# Patient Record
Sex: Female | Born: 1995 | Race: White | Hispanic: No | Marital: Single | Smoking: Never smoker
Health system: Southern US, Community
[De-identification: ages and names within clinical notes are randomized; demographics above are authoritative.]

## PROBLEM LIST (undated history)

## (undated) DIAGNOSIS — D649 Anemia, unspecified: Secondary | ICD-10-CM

## (undated) DIAGNOSIS — R569 Unspecified convulsions: Secondary | ICD-10-CM

## (undated) DIAGNOSIS — A1801 Tuberculosis of spine: Secondary | ICD-10-CM

## (undated) DIAGNOSIS — F32A Depression, unspecified: Secondary | ICD-10-CM

## (undated) DIAGNOSIS — Q600 Renal agenesis, unilateral: Secondary | ICD-10-CM

## (undated) HISTORY — DX: Renal agenesis, unilateral: Q60.0

## (undated) HISTORY — PX: ABDOMINAL EXPLORATION SURGERY: SHX538

## (undated) HISTORY — DX: Depression, unspecified: F32.A

## (undated) HISTORY — DX: Unspecified convulsions: R56.9

## (undated) HISTORY — DX: Tuberculosis of spine: A18.01

## (undated) HISTORY — PX: ANKLE SURGERY: SHX546

## (undated) HISTORY — DX: Anemia, unspecified: D64.9

---

## 2015-08-23 HISTORY — PX: ABDOMINAL HYSTERECTOMY: SHX81

## 2020-08-22 HISTORY — PX: HIP ARTHROSCOPY W/ LABRAL REPAIR: SHX1750

## 2020-12-09 HISTORY — PX: HIP SURGERY: SHX245

## 2021-03-19 ENCOUNTER — Encounter: Payer: Self-pay | Admitting: Neurology

## 2021-03-24 ENCOUNTER — Encounter: Payer: Self-pay | Admitting: Neurology

## 2021-03-24 ENCOUNTER — Other Ambulatory Visit: Payer: Self-pay

## 2021-03-24 ENCOUNTER — Ambulatory Visit (INDEPENDENT_AMBULATORY_CARE_PROVIDER_SITE_OTHER): Payer: BC Managed Care – PPO | Admitting: Neurology

## 2021-03-24 VITALS — BP 106/68 | HR 82 | Ht 70.0 in | Wt 138.6 lb

## 2021-03-24 DIAGNOSIS — R42 Dizziness and giddiness: Secondary | ICD-10-CM | POA: Diagnosis not present

## 2021-03-24 DIAGNOSIS — G40309 Generalized idiopathic epilepsy and epileptic syndromes, not intractable, without status epilepticus: Secondary | ICD-10-CM | POA: Diagnosis not present

## 2021-03-24 NOTE — Progress Notes (Signed)
NEUROLOGY CONSULTATION NOTE  Regina Kerr MRN: [id number] DOB: February 28, 1996  Referring provider: Dr. Beaulah Corin Primary care provider: none listed  Reason for consult:  establish care for epilepsy  Dear Dr Maisie Fus:  Thank you for your kind referral of Regina Kerr for consultation of the above symptoms. Although their history is well known to you, please allow me to reiterate it for the purpose of our medical record. She is alone in the office today. Records and images were personally reviewed where available.   HISTORY OF PRESENT ILLNESS: This is a 25 year old right-handed female with a history of kidney stones, epilepsy, presenting to establish care. Records from Dr. Maisie Fus, their epileptologist in [place] from 2017 to 2022, were reviewed, last visit was in 01/2021. At age 44, they started having episodes of staring, they could see and hear but could not understand and was answering gibberish. The first generalized convulsion occurred at age 9, they had woken up feeling unwell, very dizzy and lay on the couch, waking up to EMS around them. They were seen in the ER and started on seizure medication which they took until age 51. They were seizure-free off medication until they had surgery at age 58 and seizures recurred. They recall episodes in 2018 where they would have "brain zaps" and feel "twitchy" but conscious, no loss of time, occurring 50-100 times before they went to bed. This lasted for the summer then stopped. 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 Prior ASMs included Keppra, Tegretol, Depakote, Topamax, Lamictal (rash), which caused mood changes or oversedation. They tolerated Vimpat but stopped it on their own. They estimated around 10 lifetime GTC seizures and states they have not had "real" seizures since age 89. Every time they have anesthesia, they have multiple  reportedly convulsive type seizures coming out of anesthesia, last time this occurred was a few hours after hip surgery in April 2022 where they had 7 seizures. Last seizure was in June 2022 with an aura of body getting warm like a wave from feet to head and a swhoosh sound in their head. No loss of awareness but some confusion. Seizures usually occur before bedtime or early morning, triggered by poor sleep. They live with their partner who has described seizures starting with staring/unresponsivenes followed by a GTC. No focal weakness after, they usually have a headache.   They have been on Vimpat 200mg  BID. In October 2021 they were having significant nausea, dizziness "24/7" with trouble walking, that lasted until April 2022. Consideration for switching Vimpat was done, however they reported symptoms suddenly improved, dizziness is not as bad with a spinning sensation like the room is moving/tilting, now occurring sporadically 1-2 times a day lasting 15-60 minutes. They say they feel it right now during today's visit. No associated headaches, focal numbness/tingling/weakness. No neck/back pain, bladder dysfunction. There is some constipation. Mood is good on mirtazapine 60mg  daily, sleep good with Trazodone 75mg  qhs. They drive. No pregnancy plans, s/p hysterectomy.   Epilepsy Risk Factors:  Brother had seizures secondary to brain tumor. Otherwise they had a normal birth and early development.  There is no history of febrile convulsions, CNS infections such as meningitis/encephalitis, significant traumatic brain injury, neurosurgical procedures.  Diagnostic Data: Prior MRI report unavailable for review, patient noted it may have showed an abnoramlity of swelling in the left frontal lobe but unclear. MRI brain 05/2020 with and without contrast unremarkable MRI  brain 10/2015: unremarkable  EEG 10/2015 within normal limits. EEG 04/2018: single burst of irregular slowing with what appears to be intermixed  spikes.   Prior ASMs: Keppra, Tegretol, Depakote, Topamax, Lamictal (rash   PAST MEDICAL HISTORY: Past Medical History:  Diagnosis Date   Congenital absence of one kidney    Seizures (HCC)     PAST SURGICAL HISTORY: Past Surgical History:  Procedure Laterality Date   ABDOMINAL EXPLORATION SURGERY     ABDOMINAL HYSTERECTOMY  2017   ANKLE SURGERY     x 3   HIP ARTHROSCOPY W/ LABRAL REPAIR  2022   HIP SURGERY  12/09/2020    MEDICATIONS: Current Outpatient Medications on File Prior to Visit  Medication Sig Dispense Refill   lacosamide (VIMPAT) 200 MG TABS tablet Take 200 mg by mouth 2 (two) times daily.     linaclotide (LINZESS) 72 MCG capsule Take 72 mcg by mouth daily before breakfast.     meloxicam (MOBIC) 15 MG tablet Take 15 mg by mouth daily.     MIRTAZAPINE PO Take 60 mg by mouth daily.     traZODone (DESYREL) 50 MG tablet Take 75 mg by mouth at bedtime.     No current facility-administered medications on file prior to visit.    ALLERGIES: Allergies  Allergen Reactions   Amoxicillin     FAMILY HISTORY: Family History  Problem Relation Age of Onset   Hypertension Maternal Grandmother    Diabetes Maternal Grandmother     SOCIAL HISTORY: Social History   Socioeconomic History   Marital status: Significant Other    Spouse name: Not on file   Number of children: Not on file   Years of education: Not on file   Highest education level: Not on file  Occupational History   Not on file  Tobacco Use   Smoking status: Never   Smokeless tobacco: Never  Vaping Use   Vaping Use: Never used  Substance and Sexual Activity   Alcohol use: Never   Drug use: Never   Sexual activity: Not on file  Other Topics Concern   Not on file  Social History Narrative   Right handed    Social Determinants of Health   Financial Resource Strain: Not on file  Food Insecurity: Not on file  Transportation Needs: Not on file  Physical Activity: Not on file  Stress: Not on  file  Social Connections: Not on file  Intimate Partner Violence: Not on file     PHYSICAL EXAM: Vitals:   03/24/21 0840  BP: 106/68  Pulse: 82  SpO2: 99%   General: No acute distress Head:  Normocephalic/atraumatic Skin/Extremities: No rash, no edema Neurological Exam: Mental status: alert and oriented to person, place, and time, no dysarthria or aphasia, Fund of knowledge is appropriate.  Recent and remote memory are intact, 3/3 delayed recall.  Attention and concentration are normal, 5/5 WORLD backward. Cranial nerves: CN I: not tested CN II: pupils equal, round and reactive to light, visual fields intact CN III, IV, VI:  full range of motion, no nystagmus, no ptosis CN V: facial sensation intact CN VII: upper and lower face symmetric CN VIII: hearing intact to conversation Bulk & Tone: normal, no fasciculations. Motor: 5/5 throughout with no pronator drift. Sensation: intact to light touch, cold, pin, vibration and joint position sense.  No extinction to double simultaneous stimulation.  Romberg test negative Deep Tendon Reflexes: +2 throughout Cerebellar: no incoordination on finger to nose testing Gait: narrow-based and steady, able  to tandem walk adequately. Tremor: none   IMPRESSION: This is a 24 year old right-handed female with a history of kidney stones, epilepsy, presenting to establish care. EEG showed single burst of irregular generalized slowing with possible intermixed spikes concerning for generalized epilepsy. They have had side effects to various medications, currently overall doing well on Lacosamide 200mg  BID with last seizure in 01/2021. They were having significant dizziness, this has improved but still occurs 1-2 times a day, they will be referred for vestibular therapy to see if this helps with symptoms.  [place] driving laws were discussed with the patient, and they knows to stop driving after a seizure, until 6 months seizure-free. Follow-up in 6 months, call  for any changes.    Thank you for allowing me to participate in the care of this patient. Please do not hesitate to call for any questions or concerns.   02/2021, M.D.  CC: Dr. Patrcia Dolly

## 2021-03-24 NOTE — Patient Instructions (Signed)
Good to meet you!  Continue Lacosamide 200mg  twice a day  2. Referral will be sent for Vestibular therapy  3. Follow-up in 6 months, call for any changes   Seizure Precautions: 1. If medication has been prescribed for you to prevent seizures, take it exactly as directed.  Do not stop taking the medicine without talking to your doctor first, even if you have not had a seizure in a long time.   2. Avoid activities in which a seizure would cause danger to yourself or to others.  Don't operate dangerous machinery, swim alone, or climb in high or dangerous places, such as on ladders, roofs, or girders.  Do not drive unless your doctor says you may.  3. If you have any warning that you may have a seizure, lay down in a safe place where you can't hurt yourself.    4.  No driving for 6 months from last seizure, as per [facility].   Please refer to the following link on the Epilepsy Foundation of America's website for more information: http://www.epilepsyfoundation.org/answerplace/Social/driving/drivingu.cfm   5.  Maintain good sleep hygiene. Avoid alcohol.  6.  Contact your doctor if you have any problems that may be related to the medicine you are taking.  7.  Call 911 and bring the patient back to the ED if:        A.  The seizure lasts longer than 5 minutes.       B.  The patient doesn't awaken shortly after the seizure  C.  The patient has new problems such as difficulty seeing, speaking or moving  D.  The patient was injured during the seizure  E.  The patient has a temperature over 102 F (39C)  F.  The patient vomited and now is having trouble breathing

## 2021-03-30 ENCOUNTER — Other Ambulatory Visit: Payer: Self-pay

## 2021-03-31 MED ORDER — LACOSAMIDE 200 MG PO TABS: 200.0000 mg | ORAL_TABLET | Freq: Two times a day (BID) | ORAL | 5 refills | Status: DC

## 2021-04-07 ENCOUNTER — Other Ambulatory Visit: Payer: Self-pay

## 2021-04-07 ENCOUNTER — Ambulatory Visit: Payer: BC Managed Care – PPO | Admitting: Nurse Practitioner

## 2021-04-07 ENCOUNTER — Encounter: Payer: Self-pay | Admitting: Nurse Practitioner

## 2021-04-07 VITALS — BP 110/60 | HR 84 | Temp 98.5°F | Ht 69.6 in | Wt 137.8 lb

## 2021-04-07 DIAGNOSIS — Z7689 Persons encountering health services in other specified circumstances: Secondary | ICD-10-CM

## 2021-04-07 DIAGNOSIS — M25551 Pain in right hip: Secondary | ICD-10-CM | POA: Diagnosis not present

## 2021-04-07 DIAGNOSIS — G4700 Insomnia, unspecified: Secondary | ICD-10-CM

## 2021-04-07 DIAGNOSIS — F32A Depression, unspecified: Secondary | ICD-10-CM | POA: Diagnosis not present

## 2021-04-07 DIAGNOSIS — M25552 Pain in left hip: Secondary | ICD-10-CM

## 2021-04-07 DIAGNOSIS — R001 Bradycardia, unspecified: Secondary | ICD-10-CM | POA: Diagnosis not present

## 2021-04-07 MED ORDER — TRAZODONE HCL 50 MG PO TABS: 75.0000 mg | ORAL_TABLET | Freq: Every day | ORAL | 1 refills | Status: DC

## 2021-04-07 MED ORDER — MIRTAZAPINE 30 MG PO TABS: 60.0000 mg | ORAL_TABLET | Freq: Every day | ORAL | 0 refills | Status: DC

## 2021-04-07 MED ORDER — MELOXICAM 15 MG PO TABS: 15.0000 mg | ORAL_TABLET | Freq: Every day | ORAL | 0 refills | Status: DC

## 2021-04-07 NOTE — Progress Notes (Signed)
I,Yamilka Roman [employer] as a Neurosurgeon for [employer], NP.,have documented all relevant documentation on the behalf of [employer], NP,as directed by  Charlesetta Ivory, NP while in the presence of Charlesetta Ivory, NP.  This visit occurred during the SARS-CoV-2 public health emergency.  Safety protocols were in place, including screening questions prior to the visit, additional usage of staff PPE, and extensive cleaning of exam room while observing appropriate contact time as indicated for disinfecting solutions.  Subjective:     Patient ID: Regina Kerr , female    DOB: [birth year] , 25 y.o.   MRN: [id number]   Chief Complaint  Patient presents with   Insomnia   Depression   Joint Pain   Establish Care     HPI  Patient presents today to establish primary care. Goes by the name "Regina Kerr."  She recently moved here from [place]. She would like refills on her trazodone, mirtazapine and meloxicam. Reviewed labs, meds and history with patient.  She has been taking these meds for the past 3-4 years. Will retrieve results from her PCP and all the specialist she saw in [place].  PHD program for clinical psych at [facility]  Joint surgeries. 3 ankle and 2 hip surgery. Hysterectomy. She has Dystonia. Epilepsy. Sees a Neurologist. Low BP. Constipation. A lot of heart issues will refer to cardiologist.  Diet: Eats a protein, has sandwich in the lunch. Protein and carbs.  Exercise: She had hip surgery 4 months ago. So not doing much of exercise or physical activity .    Past Medical History:  Diagnosis Date   Anemia    Congenital absence of one kidney    Depression    Pott's disease    Seizures (HCC)      Family History  Problem Relation Age of Onset   Hypertension Maternal Grandmother    Diabetes Maternal Grandmother      Current Outpatient Medications:    lacosamide (VIMPAT) 200 MG TABS tablet, Take 1 tablet (200 mg total) by mouth 2 (two) times daily., Disp: 60  tablet, Rfl: 5   linaclotide (LINZESS) 72 MCG capsule, Take 72 mcg by mouth daily before breakfast., Disp: , Rfl:    mirtazapine (REMERON) 30 MG tablet, Take 2 tablets (60 mg total) by mouth daily., Disp: 60 tablet, Rfl: 0   meloxicam (MOBIC) 15 MG tablet, Take 1 tablet (15 mg total) by mouth daily., Disp: 30 tablet, Rfl: 0   traZODone (DESYREL) 50 MG tablet, Take 1.5 tablets (75 mg total) by mouth at bedtime., Disp: 30 tablet, Rfl: 1   Allergies  Allergen Reactions   Amoxicillin      Review of Systems  Constitutional: Negative.  Negative for chills, fatigue and fever.  HENT:  Negative for congestion, rhinorrhea, sinus pain and sneezing.   Eyes: Negative.   Respiratory:  Negative for choking, shortness of breath and wheezing.   Cardiovascular:  Negative for chest pain and palpitations.  Musculoskeletal:  Positive for arthralgias.  Skin: Negative.   Neurological: Negative.  Negative for headaches.  Psychiatric/Behavioral:  Positive for sleep disturbance.     Today's Vitals   04/07/21 0907  BP: 110/60  Pulse: 84  Temp: 98.5 F (36.9 C)  Weight: 137 lb 12.8 oz (62.5 kg)  Height: 5' 9.6" (1.768 m)  PainSc: 0-No pain   Body mass index is 20 kg/m.   Objective:  Physical Exam Constitutional:      Appearance: Normal appearance.  HENT:     Head: Normocephalic and atraumatic.  Cardiovascular:     Rate and Rhythm: Normal rate and regular rhythm.     Pulses: Normal pulses.     Heart sounds: Normal heart sounds. No murmur heard. Pulmonary:     Effort: Pulmonary effort is normal. No respiratory distress.     Breath sounds: Normal breath sounds. No wheezing.  Skin:    General: Skin is warm and dry.     Capillary Refill: Capillary refill takes less than 2 seconds.  Neurological:     Mental Status: She is alert.        Assessment And Plan:     1. Establishing care with new doctor, encounter for --Patient is here to establish care. Micah Flesher over patient medical, family, social  and surgical history. -Reviewed with patient their medications and any allergies  -Reviewed with patient their sexual orientation, drug/tobacco and alcohol use -Dicussed any new concerns with patient  -recommended patient comes in for a physical exam and complete blood work.  -Educated patient about the importance of annual screenings and immunizations.  -Advised patient to eat a healthy diet along with exercise for atleast 30-45 min atleast 4-5 days of the week.   2. Insomnia, unspecified type - traZODone (DESYREL) 50 MG tablet; Take 1.5 tablets (75 mg total) by mouth at bedtime.  Dispense: 30 tablet; Refill: 1  3. Depression, unspecified depression type - mirtazapine (REMERON) 30 MG tablet; Take 2 tablets (60 mg total) by mouth daily.  Dispense: 60 tablet; Refill: 0 - Ambulatory referral to Psychiatry  4. Bradycardia -History of low BP and HR.  - Ambulatory referral to Cardiology  5. Pain of both hip joints - meloxicam (MOBIC) 15 MG tablet; Take 1 tablet (15 mg total) by mouth daily.  Dispense: 30 tablet; Refill: 0   The patient was encouraged to call or send a message through MyChart for any questions or concerns.   Follow up: 3 months for complete physical exam.   Side effects and appropriate use of all the medication(s) were discussed with the patient today. Patient advised to use the medication(s) as directed by their healthcare provider. The patient was encouraged to read, review, and understand all associated package inserts and contact our office with any questions or concerns. The patient accepts the risks of the treatment plan and had an opportunity to ask questions.   -SE including drug interactions of all medications refilled were discussed in detail with the patient. Will retrieve records from the patient's prior PCP an specialists. Will refer patient to cardiologist and psychiatrist for her medical history and further management of her drug therapies. Patient verbalized  understanding of all medication SE and treatment plan.   Patient was given opportunity to ask questions. Patient verbalized understanding of the plan and was able to repeat key elements of the plan. All questions were answered to their satisfaction.  Raman Bular Hickok, DNP   I, Raman Sadrac Zeoli have reviewed all documentation for this visit. The documentation on 04/07/21 for the exam, diagnosis, procedures, and orders are all accurate and complete.    IF YOU HAVE BEEN REFERRED TO A SPECIALIST, IT MAY TAKE 1-2 WEEKS TO SCHEDULE/PROCESS THE REFERRAL. IF YOU HAVE NOT HEARD FROM US/SPECIALIST IN TWO WEEKS, PLEASE GIVE [place] A CALL AT [phone].   THE PATIENT IS ENCOURAGED TO PRACTICE SOCIAL DISTANCING DUE TO THE COVID-19 PANDEMIC.

## 2021-04-07 NOTE — Patient Instructions (Signed)
Insomnia Insomnia is a sleep disorder that makes it difficult to fall asleep or stay asleep. Insomnia can cause fatigue, low energy, difficulty concentrating, moodswings, and poor performance at work or school. There are three different ways to classify insomnia: Difficulty falling asleep. Difficulty staying asleep. Waking up too early in the morning. Any type of insomnia can be long-term (chronic) or short-term (acute). Both are common. Short-term insomnia usually lasts for three months or less. Chronic insomnia occurs at least three times a week for longer than threemonths. What are the causes? Insomnia may be caused by another condition, situation, or substance, such as: Anxiety. Certain medicines. Gastroesophageal reflux disease (GERD) or other gastrointestinal conditions. Asthma or other breathing conditions. Restless legs syndrome, sleep apnea, or other sleep disorders. Chronic pain. Menopause. Stroke. Abuse of alcohol, tobacco, or illegal drugs. Mental health conditions, such as depression. Caffeine. Neurological disorders, such as Alzheimer's disease. An overactive thyroid (hyperthyroidism). Sometimes, the cause of insomnia may not be known. What increases the risk? Risk factors for insomnia include: Gender. Women are affected more often than men. Age. Insomnia is more common as you get older. Stress. Lack of exercise. Irregular work schedule or working night shifts. Traveling between different time zones. Certain medical and mental health conditions. What are the signs or symptoms? If you have insomnia, the main symptom is having trouble falling asleep or having trouble staying asleep. This may lead to other symptoms, such as: Feeling fatigued or having low energy. Feeling nervous about going to sleep. Not feeling rested in the morning. Having trouble concentrating. Feeling irritable, anxious, or depressed. How is this diagnosed? This condition may be diagnosed based  on: Your symptoms and medical history. Your health care provider may ask about: Your sleep habits. Any medical conditions you have. Your mental health. A physical exam. How is this treated? Treatment for insomnia depends on the cause. Treatment may focus on treating an underlying condition that is causing insomnia. Treatment may also include: Medicines to help you sleep. Counseling or therapy. Lifestyle adjustments to help you sleep better. Follow these instructions at home: Eating and drinking  Limit or avoid alcohol, caffeinated beverages, and cigarettes, especially close to bedtime. These can disrupt your sleep. Do not eat a large meal or eat spicy foods right before bedtime. This can lead to digestive discomfort that can make it hard for you to sleep.  Sleep habits  Keep a sleep diary to help you and your health care provider figure out what could be causing your insomnia. Write down: When you sleep. When you wake up during the night. How well you sleep. How rested you feel the next day. Any side effects of medicines you are taking. What you eat and drink. Make your bedroom a dark, comfortable place where it is easy to fall asleep. Put up shades or blackout curtains to block light from outside. Use a white noise machine to block noise. Keep the temperature cool. Limit screen use before bedtime. This includes: Watching TV. Using your smartphone, tablet, or computer. Stick to a routine that includes going to bed and waking up at the same times every day and night. This can help you fall asleep faster. Consider making a quiet activity, such as reading, part of your nighttime routine. Try to avoid taking naps during the day so that you sleep better at night. Get out of bed if you are still awake after 15 minutes of trying to sleep. Keep the lights down, but try reading or doing a quiet   activity. When you feel sleepy, go back to bed.  General instructions Take over-the-counter  and prescription medicines only as told by your health care provider. Exercise regularly, as told by your health care provider. Avoid exercise starting several hours before bedtime. Use relaxation techniques to manage stress. Ask your health care provider to suggest some techniques that may work well for you. These may include: Breathing exercises. Routines to release muscle tension. Visualizing peaceful scenes. Make sure that you drive carefully. Avoid driving if you feel very sleepy. Keep all follow-up visits as told by your health care provider. This is important. Contact a health care provider if: You are tired throughout the day. You have trouble in your daily routine due to sleepiness. You continue to have sleep problems, or your sleep problems get worse. Get help right away if: You have serious thoughts about hurting yourself or someone else. If you ever feel like you may hurt yourself or others, or have thoughts about taking your own life, get help right away. You can go to your nearest emergency department or call: Your local emergency services (911 in the [place] A suicide crisis helpline, such as the National Suicide Prevention Lifeline at 1-800-273-8255. This is open 24 hours a day. Summary Insomnia is a sleep disorder that makes it difficult to fall asleep or stay asleep. Insomnia can be long-term (chronic) or short-term (acute). Treatment for insomnia depends on the cause. Treatment may focus on treating an underlying condition that is causing insomnia. Keep a sleep diary to help you and your health care provider figure out what could be causing your insomnia. This information is not intended to replace advice given to you by your health care provider. Make sure you discuss any questions you have with your healthcare provider. Document Revised: 06/18/2020 Document Reviewed: 06/18/2020 Elsevier Patient Education  2022 [employer]  

## 2021-05-03 ENCOUNTER — Other Ambulatory Visit: Payer: Self-pay

## 2021-05-03 ENCOUNTER — Ambulatory Visit: Payer: BC Managed Care – PPO | Admitting: Internal Medicine

## 2021-05-03 ENCOUNTER — Encounter: Payer: Self-pay | Admitting: Internal Medicine

## 2021-05-03 DIAGNOSIS — M25551 Pain in right hip: Secondary | ICD-10-CM | POA: Diagnosis not present

## 2021-05-03 DIAGNOSIS — R001 Bradycardia, unspecified: Secondary | ICD-10-CM

## 2021-05-03 DIAGNOSIS — F32A Depression, unspecified: Secondary | ICD-10-CM

## 2021-05-03 DIAGNOSIS — G4700 Insomnia, unspecified: Secondary | ICD-10-CM | POA: Diagnosis not present

## 2021-05-03 DIAGNOSIS — G40909 Epilepsy, unspecified, not intractable, without status epilepticus: Secondary | ICD-10-CM

## 2021-05-03 DIAGNOSIS — Q600 Renal agenesis, unilateral: Secondary | ICD-10-CM

## 2021-05-03 DIAGNOSIS — I498 Other specified cardiac arrhythmias: Secondary | ICD-10-CM | POA: Insufficient documentation

## 2021-05-03 DIAGNOSIS — Z8674 Personal history of sudden cardiac arrest: Secondary | ICD-10-CM

## 2021-05-03 DIAGNOSIS — K5900 Constipation, unspecified: Secondary | ICD-10-CM | POA: Diagnosis not present

## 2021-05-03 DIAGNOSIS — G90A Postural orthostatic tachycardia syndrome (POTS): Secondary | ICD-10-CM | POA: Insufficient documentation

## 2021-05-03 DIAGNOSIS — G901 Familial dysautonomia [Riley-Day]: Secondary | ICD-10-CM

## 2021-05-03 DIAGNOSIS — M25552 Pain in left hip: Secondary | ICD-10-CM

## 2021-05-03 MED ORDER — TRAZODONE HCL 50 MG PO TABS: 75.0000 mg | ORAL_TABLET | Freq: Every day | ORAL | 3 refills | Status: DC

## 2021-05-03 MED ORDER — MELOXICAM 15 MG PO TABS: 15.0000 mg | ORAL_TABLET | Freq: Every day | ORAL | 3 refills | Status: DC

## 2021-05-03 MED ORDER — LUBIPROSTONE 8 MCG PO CAPS: 8.0000 ug | ORAL_CAPSULE | Freq: Every day | ORAL | 1 refills | Status: DC

## 2021-05-03 MED ORDER — MIRTAZAPINE 30 MG PO TABS: 60.0000 mg | ORAL_TABLET | Freq: Every day | ORAL | 1 refills | Status: DC

## 2021-05-03 NOTE — Progress Notes (Signed)
   Subjective:   Patient ID: Regina Kerr, female    DOB: 1995-09-27, 25 y.o.   MRN: [id number]  HPI The patient is a 25 YO coming new from [place], needing follow up of chronic health concerns and ongoing care. With episode of bradycardia to 30s with some increase in seizures in the last year. No pacemaker placed and resolved on its own holter done without recurrence after hospital. Last seizure last week. Taking vimpat and has established with neurologist in town. 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 Eating is okay now and mental health is controlled with remeron 60 mg daily and trazodone 75 mg daily for sleep. Some orthopedic problems as well from past sports and other with ankle and hips, recent hip replacement earlier this year.  PMH, [facility], social history reviewed and updated  Review of Systems  Constitutional: Negative.   HENT: Negative.    Eyes: Negative.   Respiratory:  Negative for cough, chest tightness and shortness of breath.   Cardiovascular:  Negative for chest pain, palpitations and leg swelling.  Gastrointestinal:  Negative for abdominal distention, abdominal pain, constipation, diarrhea, nausea and vomiting.  Musculoskeletal:  Positive for arthralgias and myalgias.  Skin: Negative.   Neurological:  Positive for seizures.  Psychiatric/Behavioral: Negative.     Objective:  Physical Exam Constitutional:      Appearance: She is well-developed.  HENT:     Head: Normocephalic and atraumatic.  Cardiovascular:     Rate and Rhythm: Normal rate and regular rhythm.     Heart sounds: No murmur heard. Pulmonary:     Effort: Pulmonary effort is normal. No respiratory distress.     Breath sounds: Normal breath sounds. No wheezing or rales.  Abdominal:     General: Bowel sounds are normal. There is no distension.     Palpations: Abdomen is soft.     Tenderness: There is no abdominal tenderness. There is no rebound.  Musculoskeletal:      Cervical back: Normal range of motion.  Skin:    General: Skin is warm and dry.  Neurological:     Mental Status: She is alert and oriented to person, place, and time.     Coordination: Coordination normal.    Vitals:   05/03/21 0801  BP: 118/80  Pulse: 66  Resp: 18  Temp: 98.1 F (36.7 C)  TempSrc: Oral  SpO2: 99%  Weight: 138 lb 6.4 oz (62.8 kg)  Height: 5' 9.6" (1.768 m)    This visit occurred during the SARS-CoV-2 public health emergency.  Safety protocols were in place, including screening questions prior to the visit, additional usage of staff PPE, and extensive cleaning of exam room while observing appropriate contact time as indicated for disinfecting solutions.   Assessment & Plan:

## 2021-05-03 NOTE — Patient Instructions (Addendum)
We will change the linzess to amitiza to try to take daily for the constipation.  We have sent in the refills of the medicines.   We will get you in with the cardiologist to have them in case we need them.

## 2021-05-03 NOTE — Assessment & Plan Note (Signed)
Since childhood and likely could be contributed to by medications. Has used linzess prn as it does cause multiple BM not much warning. Will try amitiza daily instead to see if this is more predictable. They have already tried most otc options without relief.

## 2021-05-03 NOTE — Assessment & Plan Note (Signed)
This was most likely diagnosis with recent hospital stay per patient. Will refer to cardiology. It is unclear if consideration for pacemaker is needed or further monitoring with HR 30s (possible seizure related or seizure caused by low HR). Getting records to review in more detail.

## 2021-05-04 DIAGNOSIS — Q600 Renal agenesis, unilateral: Secondary | ICD-10-CM | POA: Insufficient documentation

## 2021-05-04 DIAGNOSIS — M25551 Pain in right hip: Secondary | ICD-10-CM | POA: Insufficient documentation

## 2021-05-04 DIAGNOSIS — G4700 Insomnia, unspecified: Secondary | ICD-10-CM | POA: Insufficient documentation

## 2021-05-04 DIAGNOSIS — Z8674 Personal history of sudden cardiac arrest: Secondary | ICD-10-CM | POA: Insufficient documentation

## 2021-05-04 DIAGNOSIS — F32A Depression, unspecified: Secondary | ICD-10-CM | POA: Insufficient documentation

## 2021-05-04 DIAGNOSIS — M25552 Pain in left hip: Secondary | ICD-10-CM | POA: Insufficient documentation

## 2021-05-04 NOTE — Assessment & Plan Note (Signed)
Per reports will get records to verify and clarify renal function. Will plan for labs every 6 months (done this summer) until stable then yearly at least.

## 2021-05-04 NOTE — Assessment & Plan Note (Signed)
Will clarify this diagnosis from records.

## 2021-05-04 NOTE — Assessment & Plan Note (Signed)
Per patient in teens due to eating disorder. Will get records to review.

## 2021-05-04 NOTE — Assessment & Plan Note (Signed)
Rx trazodone 75 mg qhs for sleep which is prescribed today.

## 2021-05-04 NOTE — Assessment & Plan Note (Signed)
Taking mirtazapine 60 mg daily and controlling symptoms well. They are adequately controlled currently so medication is prescribed at current dosing.

## 2021-05-04 NOTE — Assessment & Plan Note (Signed)
Seeing neurology and most recent seizure last week. Most seizure episodes are in morning or before sleep. Advised of [place] rules to not drive within 6 months of seizure.

## 2021-05-04 NOTE — Assessment & Plan Note (Signed)
Referral to cardiology and getting records from [place]. There was prior hospitalization due to bradycardia around time of seizure with HR 30s. It does not sound like intervention was done and this did normalize. There is history of eating disorder which could be related and they add that there is also history of a RV conduction delay.

## 2021-05-04 NOTE — Assessment & Plan Note (Signed)
Rx meloxicam which has been prescribed in the past for arthritis pain. Will need to get records as they do have 1 kidney per reports to ensure normal renal function.

## 2021-06-05 DIAGNOSIS — H52221 Regular astigmatism, right eye: Secondary | ICD-10-CM | POA: Diagnosis not present

## 2021-06-05 DIAGNOSIS — H5212 Myopia, left eye: Secondary | ICD-10-CM | POA: Diagnosis not present

## 2021-07-13 ENCOUNTER — Encounter: Payer: Self-pay | Admitting: Neurology

## 2021-09-20 ENCOUNTER — Other Ambulatory Visit: Payer: Self-pay

## 2021-09-20 ENCOUNTER — Ambulatory Visit: Payer: BC Managed Care – PPO | Admitting: Neurology

## 2021-09-20 ENCOUNTER — Encounter: Payer: Self-pay | Admitting: Neurology

## 2021-09-20 VITALS — BP 117/63 | HR 72 | Ht 70.0 in | Wt 150.0 lb

## 2021-09-20 DIAGNOSIS — G40309 Generalized idiopathic epilepsy and epileptic syndromes, not intractable, without status epilepticus: Secondary | ICD-10-CM | POA: Diagnosis not present

## 2021-09-20 MED ORDER — NAYZILAM 5 MG/0.1ML NA SOLN: NASAL | 5 refills | Status: DC

## 2021-09-20 MED ORDER — LACOSAMIDE 200 MG PO TABS: 200.0000 mg | ORAL_TABLET | Freq: Two times a day (BID) | ORAL | 5 refills | Status: DC

## 2021-09-20 NOTE — Progress Notes (Signed)
NEUROLOGY FOLLOW UP OFFICE NOTE  Regina Kerr [id number] Sep 21, 1995  HISTORY OF PRESENT ILLNESS: I had the pleasure of seeing Regina Kerr in follow-up in the neurology clinic on 09/20/2021.  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 They are accompanied by their significant other Regina Kerr who helps supplement the history today. Since their last visit, they have been doing mostly good. There was one seizure in October 2022, they took evening medication (Trazodone) in the morning. It made them feel very tired and dizzy while in the dog park, they were lightheaded and lay back in the car where a couple of seizures occurred with shaking. No tongue bite or incontinence. They were periodically waking up. They felt good afterward. The spinning sensation is a lot better, no headaches. No staring/unresponsive episodes, olfactory/gustatory hallucinations, focal numbness/tingling/weakness/myoclonic jerks. They are on Lacosamide 200mg  BID without significant side effects. Regina Kerr has noticed for almost 2 years that they frequently substitue words, saying "washer" instead of "dryer," saying "yesterday" but meaning to say "tomorrow." This occurs multiple times a day, sometimes they notice it, other times people will alert them. They are a Regina Kerr in Psychology. They get 8-10 hours of sleep.     History on Initial Assessment 03/24/2021: This is a 26 year old right-handed female with a history of kidney stones, epilepsy, presenting to establish care. Records from Dr. 22, their epileptologist in [place] from 2017 to 2022, were reviewed, last visit was in 01/2021. At age 23, they started having episodes of staring, they could see and hear but could not understand and was answering gibberish. The first generalized convulsion occurred at age 26, they had woken up feeling unwell, very dizzy and lay on the couch, waking up to EMS around them. They were seen in the ER and started  on seizure medication which they took until age 26. They were seizure-free off medication until they had surgery at age 8 and seizures recurred. They recall episodes in 2018 where they would have "brain zaps" and feel "twitchy" but conscious, no loss of time, occurring 50-100 times before they went to bed. This lasted for the summer then stopped. 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 Prior ASMs included Keppra, Tegretol, Depakote, Topamax, Lamictal (rash), which caused mood changes or oversedation. They tolerated Vimpat but stopped it on their own. They estimated around 10 lifetime GTC seizures and states they have not had "real" seizures since age 26. Every time they have anesthesia, they have multiple reportedly convulsive type seizures coming out of anesthesia, last time this occurred was a few hours after hip surgery in April 2022 where they had 7 seizures. Last seizure was in June 2022 with an aura of body getting warm like a wave from feet to head and a swhoosh sound in their head. No loss of awareness but some confusion. Seizures usually occur before bedtime or early morning, triggered by poor sleep. They live with their partner who has described seizures starting with staring/unresponsivenes followed by a GTC. No focal weakness after, they usually have a headache.   They have been on Vimpat 200mg  BID. In October 2021 they were having significant nausea, dizziness "24/7" with trouble walking, that lasted until April 2022. Consideration for switching Vimpat was done, however they reported symptoms suddenly improved, dizziness is not as bad with a spinning sensation like the room is moving/tilting, now occurring sporadically 1-2 times a day  lasting 15-60 minutes. They say they feel it right now during today's visit. No associated headaches, focal numbness/tingling/weakness. No neck/back pain, bladder  dysfunction. There is some constipation. Mood is good on mirtazapine 60mg  daily, sleep good with Trazodone 75mg  qhs. They drive. No pregnancy plans, s/p hysterectomy.   Epilepsy Risk Factors:  Brother had seizures secondary to brain tumor. Otherwise they had a normal birth and early development.  There is no history of febrile convulsions, CNS infections such as meningitis/encephalitis, significant traumatic brain injury, neurosurgical procedures.  Diagnostic Data: Prior MRI report unavailable for review, patient noted it may have showed an abnoramlity of swelling in the left frontal lobe but unclear. MRI brain 05/2020 with and without contrast unremarkable MRI brain 10/2015: unremarkable  EEG 10/2015 within normal limits. EEG 04/2018: single burst of irregular slowing with what appears to be intermixed spikes.   Prior ASMs: Keppra, Tegretol, Depakote, Topamax, Lamictal (rash   PAST MEDICAL HISTORY: Past Medical History:  Diagnosis Date   Anemia    Congenital absence of one kidney    Depression    Pott's disease    Seizures (HCC)     MEDICATIONS: Current Outpatient Medications on File Prior to Visit  Medication Sig Dispense Refill   lacosamide (VIMPAT) 200 MG TABS tablet Take 1 tablet (200 mg total) by mouth 2 (two) times daily. 60 tablet 5   linaclotide (LINZESS) 72 MCG capsule Take 72 mcg by mouth daily before breakfast.     lubiprostone (AMITIZA) 8 MCG capsule Take 1 capsule (8 mcg total) by mouth daily with breakfast. 90 capsule 1   meloxicam (MOBIC) 15 MG tablet Take 1 tablet (15 mg total) by mouth daily. 90 tablet 3   mirtazapine (REMERON) 30 MG tablet Take 2 tablets (60 mg total) by mouth daily. 180 tablet 1   traZODone (DESYREL) 50 MG tablet Take 1.5 tablets (75 mg total) by mouth at bedtime. (Patient not taking: Reported on 09/20/2021) 135 tablet 3   No current facility-administered medications on file prior to visit.    ALLERGIES: Allergies  Allergen Reactions    Amoxicillin     FAMILY HISTORY: Family History  Problem Relation Age of Onset   Hypertension Maternal Grandmother    Diabetes Maternal Grandmother     SOCIAL HISTORY: Social History   Socioeconomic History   Marital status: Significant Other    Spouse name: Not on file   Number of children: Not on file   Years of education: Not on file   Highest education level: Not on file  Occupational History   Not on file  Tobacco Use   Smoking status: Never   Smokeless tobacco: Never  Vaping Use   Vaping Use: Never used  Substance and Sexual Activity   Alcohol use: Never   Drug use: Never   Sexual activity: Not on file  Other Topics Concern   Not on file  Social History Narrative   Right handed    Social Determinants of Health   Financial Resource Strain: Not on file  Food Insecurity: Not on file  Transportation Needs: Not on file  Physical Activity: Not on file  Stress: Not on file  Social Connections: Not on file  Intimate Partner Violence: Not on file     PHYSICAL EXAM: Vitals:   09/20/21 1438  BP: 117/63  Pulse: 72  SpO2: 98%   General: No acute distress Head:  Normocephalic/atraumatic Skin/Extremities: No rash, no edema Neurological Exam: alert awake. No aphasia or dysarthria. Fund of knowledge is  appropriate.  Attention and concentration are normal.   Cranial nerves: Pupils equal, round. Extraocular movements intact with no nystagmus. Visual fields full.  No facial asymmetry.  Motor: Bulk and tone normal, muscle strength 5/5 throughout with no pronator drift.   Finger to nose testing intact.  Gait narrow-based and steady, able to tandem walk adequately.  Romberg negative.   IMPRESSION: This is a 26 yo RH female with a history of kidney stones with generalized epilepsy. EEG showed single burst of irregular generalized slowing with possible intermixed spikes. Overall doing well with a couple of seizures in October 2022, tolerating Lacosamide 200mg  BID. Nayzilam  nasal spray will be prescribed to prevent seizure clusters, side effects discussed. Consider speech therapy for speech changes. They are aware of [place] driving laws to stop driving after a seizure until 6 months seizure-free. Follow-up in 6 months, call for any changes.   Thank you for allowing me to participate in their care.  Please do not hesitate to call for any questions or concerns.    , M.D.   CC: Dr. Patrcia Dolly

## 2021-09-20 NOTE — Patient Instructions (Signed)
Good to see you.  Continue Vimpat 200mg  twice a day  2. Prescription for Nayzilam nasal spray has been sent for seizure rescue.  3. Consider doing speech therapy  4. Follow-up in 6 months, call for any changes   Seizure Precautions: 1. If medication has been prescribed for you to prevent seizures, take it exactly as directed.  Do not stop taking the medicine without talking to your doctor first, even if you have not had a seizure in a long time.   2. Avoid activities in which a seizure would cause danger to yourself or to others.  Don't operate dangerous machinery, swim alone, or climb in high or dangerous places, such as on ladders, roofs, or girders.  Do not drive unless your doctor says you may.  3. If you have any warning that you may have a seizure, lay down in a safe place where you can't hurt yourself.    4.  No driving for 6 months from last seizure, as per [facility].   Please refer to the following link on the [place] website for more information: http://www.epilepsyfoundation.org/answerplace/Social/driving/drivingu.cfm   5.  Maintain good sleep hygiene. Avoid alcohol.  6.  Contact your doctor if you have any problems that may be related to the medicine you are taking.  7.  Call 911 and bring the patient back to the ED if:        A.  The seizure lasts longer than 5 minutes.       B.  The patient doesn't awaken shortly after the seizure  C.  The patient has new problems such as difficulty seeing, speaking or moving  D.  The patient was injured during the seizure  E.  The patient has a temperature over 102 F (39C)  F.  The patient vomited and now is having trouble breathing

## 2021-09-27 DIAGNOSIS — F439 Reaction to severe stress, unspecified: Secondary | ICD-10-CM | POA: Diagnosis not present

## 2021-09-29 ENCOUNTER — Ambulatory Visit: Payer: BC Managed Care – PPO | Admitting: Neurology

## 2021-10-11 ENCOUNTER — Ambulatory Visit: Payer: BC Managed Care – PPO | Admitting: Neurology

## 2021-10-19 DIAGNOSIS — F4312 Post-traumatic stress disorder, chronic: Secondary | ICD-10-CM | POA: Diagnosis not present

## 2021-10-26 DIAGNOSIS — F4312 Post-traumatic stress disorder, chronic: Secondary | ICD-10-CM | POA: Diagnosis not present

## 2021-11-02 DIAGNOSIS — F4312 Post-traumatic stress disorder, chronic: Secondary | ICD-10-CM | POA: Diagnosis not present

## 2021-11-05 ENCOUNTER — Other Ambulatory Visit: Payer: Self-pay

## 2021-11-05 ENCOUNTER — Encounter: Payer: Self-pay | Admitting: Internal Medicine

## 2021-11-05 ENCOUNTER — Ambulatory Visit: Payer: BC Managed Care – PPO | Admitting: Internal Medicine

## 2021-11-05 VITALS — BP 112/70 | HR 54 | Resp 18 | Ht 70.0 in | Wt 151.4 lb

## 2021-11-05 DIAGNOSIS — G901 Familial dysautonomia [Riley-Day]: Secondary | ICD-10-CM

## 2021-11-05 DIAGNOSIS — G40909 Epilepsy, unspecified, not intractable, without status epilepticus: Secondary | ICD-10-CM | POA: Diagnosis not present

## 2021-11-05 DIAGNOSIS — R6884 Jaw pain: Secondary | ICD-10-CM

## 2021-11-05 DIAGNOSIS — Q600 Renal agenesis, unilateral: Secondary | ICD-10-CM | POA: Diagnosis not present

## 2021-11-05 DIAGNOSIS — K921 Melena: Secondary | ICD-10-CM

## 2021-11-05 DIAGNOSIS — M25511 Pain in right shoulder: Secondary | ICD-10-CM

## 2021-11-05 DIAGNOSIS — G8929 Other chronic pain: Secondary | ICD-10-CM

## 2021-11-05 MED ORDER — PREDNISONE 20 MG PO TABS: 40.0000 mg | ORAL_TABLET | Freq: Every day | ORAL | 0 refills | Status: AC

## 2021-11-05 NOTE — Assessment & Plan Note (Signed)
Checking CMP and CBC and ferritin and B12 and vitamin D. Treat as needed to ensure normal and no changes to meds needed.  ?

## 2021-11-05 NOTE — Assessment & Plan Note (Signed)
Checking CMP and CBC and B12 and ferritin and vitamin D to rule out metabolic contributor. Adjust as needed.  ?

## 2021-11-05 NOTE — Assessment & Plan Note (Signed)
Checking CMP today to assess stability of renal function and stop meloxicam if needed.  ?

## 2021-11-05 NOTE — Progress Notes (Signed)
? ?  Subjective:  ? ?Patient ID: Regina Kerr, female    DOB: 11-03-1995, 26 y.o.   MRN: [id number] ? ?HPI ?The patient is a 26 YO coming in for concerns. ? ?Review of Systems  ?Constitutional: Negative.   ?HENT: Negative.    ?     Right jaw pain  ?Eyes: Negative.   ?Respiratory:  Negative for cough, chest tightness and shortness of breath.   ?Cardiovascular:  Negative for chest pain, palpitations and leg swelling.  ?Gastrointestinal:  Positive for blood in stool. Negative for abdominal distention, abdominal pain, constipation, diarrhea, nausea and vomiting.  ?Musculoskeletal:  Positive for arthralgias and myalgias.  ?Skin: Negative.   ?Neurological: Negative.   ?Psychiatric/Behavioral: Negative.    ? ?Objective:  ?Physical Exam ?Constitutional:   ?   Appearance: Regina Kerr is well-developed.  ?HENT:  ?   Head: Normocephalic and atraumatic.  ?Cardiovascular:  ?   Rate and Rhythm: Normal rate and regular rhythm.  ?Pulmonary:  ?   Effort: Pulmonary effort is normal. No respiratory distress.  ?   Breath sounds: Normal breath sounds. No wheezing or rales.  ?Abdominal:  ?   General: Bowel sounds are normal. There is no distension.  ?   Palpations: Abdomen is soft.  ?   Tenderness: There is no abdominal tenderness. There is no rebound.  ?Musculoskeletal:     ?   General: Tenderness present.  ?   Cervical back: Normal range of motion.  ?   Comments: Right AC tenderness  ?Skin: ?   General: Skin is warm and dry.  ?Neurological:  ?   Mental Status: Regina Kerr is alert and oriented to person, place, and time.  ?   Coordination: Coordination normal.  ? ? ?Vitals:  ? 11/05/21 0807  ?BP: 112/70  ?Pulse: (!) 54  ?Resp: 18  ?SpO2: 98%  ?Weight: 151 lb 6.4 oz (68.7 kg)  ?Height: 5\' 10"  (1.778 m)  ? ? ?This visit occurred during the SARS-CoV-2 public health emergency.  Safety protocols were in place, including screening questions prior to the visit, additional usage of staff PPE, and extensive cleaning of exam  room while observing appropriate contact time as indicated for disinfecting solutions.  ? ?Assessment & Plan:  ? ?

## 2021-11-05 NOTE — Patient Instructions (Addendum)
We will get you in with sports medicine and have sent in prednisone to take 2 pills daily for 3 days to calm down the jaw pain. ? ?We are checking the labs. ?

## 2021-11-05 NOTE — Assessment & Plan Note (Signed)
Prior evaluation about 10 years ago with what sounds to be sigmoidoscopy which was normal. Longstanding constipation makes internal hemorrhoids most likely. Checking CBC to ensure no significant blood loss. If so needs referral to GI for evaluation. Offered suppository treatment for hemorrhoids at this time.  ?

## 2021-11-05 NOTE — Assessment & Plan Note (Signed)
Suspect TMJ. There is teeth grinding and uses mouthguard daily from dentist. Rx prednisone 3 day course and given information. Already doing some exercises for this. ?

## 2021-11-05 NOTE — Assessment & Plan Note (Signed)
Suspect rotator cuff injury but needs assessment with sports medicine referral done as the goal is return to sports/working out.  ?

## 2021-11-08 ENCOUNTER — Other Ambulatory Visit: Payer: Self-pay

## 2021-11-08 ENCOUNTER — Other Ambulatory Visit: Payer: BC Managed Care – PPO

## 2021-11-08 DIAGNOSIS — F32A Depression, unspecified: Secondary | ICD-10-CM

## 2021-11-08 LAB — COMPREHENSIVE METABOLIC PANEL
ALT: 16 U/L (ref 0–35)
AST: 21 U/L (ref 0–37)
Albumin: 4.8 g/dL (ref 3.5–5.2)
Alkaline Phosphatase: 59 U/L (ref 39–117)
BUN: 18 mg/dL (ref 6–23)
CO2: 28 mEq/L (ref 19–32)
Calcium: 9.6 mg/dL (ref 8.4–10.5)
Chloride: 103 mEq/L (ref 96–112)
Creatinine, Ser: 0.87 mg/dL (ref 0.40–1.20)
GFR: 91.98 mL/min (ref >60.00)
Glucose, Bld: 88 mg/dL (ref 70–99)
Potassium: 4.2 mEq/L (ref 3.5–5.1)
Sodium: 139 mEq/L (ref 135–145)
Total Bilirubin: 0.5 mg/dL (ref 0.2–1.2)
Total Protein: 7.3 g/dL (ref 6.0–8.3)

## 2021-11-08 LAB — CBC
HCT: 40 % (ref 36.0–46.0)
Hemoglobin: 13.7 g/dL (ref 12.0–15.0)
MCHC: 34.4 g/dL (ref 30.0–36.0)
MCV: 91.7 fl (ref 78.0–100.0)
Platelets: 263 10*3/uL (ref 150.0–400.0)
RBC: 4.36 Mil/uL (ref 3.87–5.11)
RDW: 12.5 % (ref 11.5–15.5)
WBC: 3.6 10*3/uL — ABNORMAL LOW (ref 4.0–10.5)

## 2021-11-08 LAB — FERRITIN: Ferritin: 48.9 ng/mL (ref 10.0–291.0)

## 2021-11-08 LAB — VITAMIN B12: Vitamin B-12: 585 pg/mL (ref 211–911)

## 2021-11-08 LAB — VITAMIN D 25 HYDROXY (VIT D DEFICIENCY, FRACTURES): VITD: 28.18 ng/mL — ABNORMAL LOW (ref 30.00–100.00)

## 2021-11-08 MED ORDER — MIRTAZAPINE 30 MG PO TABS: 60.0000 mg | ORAL_TABLET | Freq: Every day | ORAL | 1 refills | Status: AC

## 2021-11-12 NOTE — Progress Notes (Signed)
? ? Regina Kerr ?[facility] ?[place] ?Phone: [phone] ?  ?Assessment and Plan:   ?  ?1. Chronic right shoulder pain ?2. Tendinopathy of right rotator cuff ?-Chronic with exacerbation, initial sports medicine visit ?- 51 old injury years ago, likely rotator cuff based on HPI, that is developed into chronic rotator cuff tendinopathy.  More acute flare of pain could be caused by flare of tendinopathy versus partial tear versus labral pathology ?- Patient agreeable to subacromial CSI.  Tolerated well per note below ?- Patient has history of congenital single kidney.  Because of this I do not recommend patient use NSAIDs.  Recommend discontinuing chronic meloxicam medication and using Tylenol instead for day-to-day pain relief ?- Start HEP and physical therapy for shoulder and rotator cuff ? ?Procedure: Subacromial Injection ?Side: Right ? ?Risks explained and consent was given verbally. The site was cleaned with alcohol prep. A steroid injection was performed from posterior approach using 72mL of 1% lidocaine without epinephrine and 56mL of kenalog 40mg /ml. This was well tolerated and resulted in symptomatic relief.  Needle was removed, hemostasis achieved, and post injection instructions were explained.   Pt was advised to call or return to clinic if these symptoms worsen or fail to improve as anticipated.  ?  ?Pertinent previous records reviewed include PCP note 11/05/2021 ?  ?Follow Up: 3 weeks for reevaluation.  Could consider ultrasound versus [facility] imaging at time if no improvement or worsening of symptoms. ?  ?Subjective:   ?I, Pincus Badder, am serving as a Education administrator for [employer] ? ?Chief Complaint: right shoulder pain  ? ?HPI:  ?11/15/2021 ?Patient is a 26 year old female complaining of right shoulder pain. Patient states that they have a 100 join problems moved here from [place] last summer, injured right shoulder  playing  volleyball and aerial silks 3-4 years ago has been to PT but the pain never went away. Is playing vb now and cant hit the ball overhand without pain in the front of the shoulder , no numbness tingling , no radiating pain, has tried ib and tylenol and they do nothing to help, takes meloxicam daily has been on long term, decreased ROM  ? ?Relevant Historical Information: Congenital unilateral kidney ? ?Additional pertinent review of systems negative. ? ? ?Current Outpatient Medications:  ?  lacosamide (VIMPAT) 200 MG TABS tablet, Take 1 tablet (200 mg total) by mouth 2 (two) times daily., Disp: 60 tablet, Rfl: 5 ?  linaclotide (LINZESS) 72 MCG capsule, Take 72 mcg by mouth daily before breakfast., Disp: , Rfl:  ?  lubiprostone (AMITIZA) 8 MCG capsule, Take 1 capsule (8 mcg total) by mouth daily with breakfast., Disp: 90 capsule, Rfl: 1 ?  meloxicam (MOBIC) 15 MG tablet, Take 1 tablet (15 mg total) by mouth daily., Disp: 90 tablet, Rfl: 3 ?  Midazolam (NAYZILAM) 5 MG/0.1ML SOLN, Administer one dose in nostril as needed for seizure. May use second dose in other nostril in another 10 minutes if seizure continues, Disp: 5 each, Rfl: 5 ?  mirtazapine (REMERON) 30 MG tablet, Take 2 tablets (60 mg total) by mouth daily., Disp: 180 tablet, Rfl: 1 ?  traZODone (DESYREL) 50 MG tablet, Take 1.5 tablets (75 mg total) by mouth at bedtime. (Patient not taking: Reported on 09/20/2021), Disp: 135 tablet, Rfl: 3  ? ?Objective:   ?  ?Vitals:  ? 11/15/21 1359  ?BP: 118/78  ?Pulse: 80  ?SpO2: 98%  ?Weight: 149  lb (67.6 kg)  ?Height: 5\' 10"  (1.778 m)  ?  ?  ?Body mass index is 21.38 kg/m?.  ?  ?Physical Exam:   ? ?Gen: Appears well, nad, nontoxic and pleasant ?Neuro:sensation intact, strength is 5/5 with df/pf/inv/ev, muscle tone wnl ?Skin: no suspicious lesion or defmority ?Psych: A&O, appropriate mood and affect ? ?Right shoulder: no deformity, swelling or muscle wasting ?No scapular winging ?FF 160, abd 160, int 20, ext 80 ?TTP anterior  shoulder musculature ?NTTP over the [place], clavicle, ac, coracoid, biceps groove, humerus, deltoid, trapezius, cervical spine ?Positive empty can, O'Brien's, subscap liftoff ?Neg neer, hawkings,   speeds,  crossarm ?Neg ant drawer, sulcus sign, apprehension ?Negative Spurling's test bilat ?FROM of neck  ? ? ?Electronically signed by:  ?Regina Kerr ?[facility] ?2:32 PM 11/15/21 ?

## 2021-11-15 ENCOUNTER — Ambulatory Visit: Payer: BC Managed Care – PPO | Admitting: Sports Medicine

## 2021-11-15 ENCOUNTER — Other Ambulatory Visit: Payer: Self-pay

## 2021-11-15 ENCOUNTER — Ambulatory Visit (INDEPENDENT_AMBULATORY_CARE_PROVIDER_SITE_OTHER): Payer: BC Managed Care – PPO

## 2021-11-15 VITALS — BP 118/78 | HR 80 | Ht 70.0 in | Wt 149.0 lb

## 2021-11-15 DIAGNOSIS — G8929 Other chronic pain: Secondary | ICD-10-CM

## 2021-11-15 DIAGNOSIS — M67911 Unspecified disorder of synovium and tendon, right shoulder: Secondary | ICD-10-CM

## 2021-11-15 DIAGNOSIS — M25511 Pain in right shoulder: Secondary | ICD-10-CM

## 2021-11-15 NOTE — Patient Instructions (Addendum)
Good to see you  ?Please discontinue NSAIDS and meloxicam  ?Shoulder HEP  ?Pt referral  ?3 week follow up  ?

## 2021-11-16 DIAGNOSIS — F4312 Post-traumatic stress disorder, chronic: Secondary | ICD-10-CM | POA: Diagnosis not present

## 2021-11-26 DIAGNOSIS — F4312 Post-traumatic stress disorder, chronic: Secondary | ICD-10-CM | POA: Diagnosis not present

## 2021-11-29 DIAGNOSIS — J988 Other specified respiratory disorders: Secondary | ICD-10-CM | POA: Diagnosis not present

## 2021-11-29 DIAGNOSIS — Z1159 Encounter for screening for other viral diseases: Secondary | ICD-10-CM | POA: Diagnosis not present

## 2021-11-30 DIAGNOSIS — F4312 Post-traumatic stress disorder, chronic: Secondary | ICD-10-CM | POA: Diagnosis not present

## 2021-12-08 NOTE — Progress Notes (Deleted)
    Aleen Sells D.[facility] [address] [place] [address] Phone: [phone]   Assessment and Plan:     There are no diagnoses linked to this encounter.  ***   Pertinent previous records reviewed include ***   Follow Up: ***     Subjective:   I, Regina Kerr, am serving as a Neurosurgeon for Doctor Richardean Sale   Chief Complaint: right shoulder pain    HPI:  11/15/2021 Patient is a 26 year old female complaining of right shoulder pain. Patient states that they have a 100 join problems moved here from [place] last summer, injured right shoulder  playing volleyball and aerial silks 3-4 years ago has been to PT but the pain never went away. Is playing vb now and cant hit the ball overhand without pain in the front of the shoulder , no numbness tingling , no radiating pain, has tried ib and tylenol and they do nothing to help, takes meloxicam daily has been on long term, decreased ROM   12/09/2021 Patient states    Relevant Historical Information: Congenital unilateral kidney  Additional pertinent review of systems negative.   Current Outpatient Medications:    lacosamide (VIMPAT) 200 MG TABS tablet, Take 1 tablet (200 mg total) by mouth 2 (two) times daily., Disp: 60 tablet, Rfl: 5   linaclotide (LINZESS) 72 MCG capsule, Take 72 mcg by mouth daily before breakfast., Disp: , Rfl:    lubiprostone (AMITIZA) 8 MCG capsule, Take 1 capsule (8 mcg total) by mouth daily with breakfast., Disp: 90 capsule, Rfl: 1   meloxicam (MOBIC) 15 MG tablet, Take 1 tablet (15 mg total) by mouth daily., Disp: 90 tablet, Rfl: 3   Midazolam (NAYZILAM) 5 MG/0.1ML SOLN, Administer one dose in nostril as needed for seizure. May use second dose in other nostril in another 10 minutes if seizure continues, Disp: 5 each, Rfl: 5   mirtazapine (REMERON) 30 MG tablet, Take 2 tablets (60 mg total) by mouth daily., Disp: 180 tablet, Rfl: 1   traZODone (DESYREL) 50 MG  tablet, Take 1.5 tablets (75 mg total) by mouth at bedtime. (Patient not taking: Reported on 09/20/2021), Disp: 135 tablet, Rfl: 3   Objective:     There were no vitals filed for this visit.    There is no height or weight on file to calculate BMI.    Physical Exam:    ***   Electronically signed by:  Aleen Sells D.[facility] 9:44 AM 12/08/21

## 2021-12-09 ENCOUNTER — Ambulatory Visit: Payer: BC Managed Care – PPO | Admitting: Sports Medicine

## 2021-12-14 DIAGNOSIS — F4312 Post-traumatic stress disorder, chronic: Secondary | ICD-10-CM | POA: Diagnosis not present

## 2021-12-21 DIAGNOSIS — F4312 Post-traumatic stress disorder, chronic: Secondary | ICD-10-CM | POA: Diagnosis not present

## 2021-12-23 DIAGNOSIS — F439 Reaction to severe stress, unspecified: Secondary | ICD-10-CM | POA: Diagnosis not present

## 2021-12-30 DIAGNOSIS — F4312 Post-traumatic stress disorder, chronic: Secondary | ICD-10-CM | POA: Diagnosis not present

## 2022-01-04 DIAGNOSIS — F4312 Post-traumatic stress disorder, chronic: Secondary | ICD-10-CM | POA: Diagnosis not present

## 2022-01-11 DIAGNOSIS — F4312 Post-traumatic stress disorder, chronic: Secondary | ICD-10-CM | POA: Diagnosis not present

## 2022-01-13 DIAGNOSIS — F439 Reaction to severe stress, unspecified: Secondary | ICD-10-CM | POA: Diagnosis not present

## 2022-01-18 DIAGNOSIS — F4312 Post-traumatic stress disorder, chronic: Secondary | ICD-10-CM | POA: Diagnosis not present

## 2022-02-01 DIAGNOSIS — F4312 Post-traumatic stress disorder, chronic: Secondary | ICD-10-CM | POA: Diagnosis not present

## 2022-02-15 DIAGNOSIS — F4312 Post-traumatic stress disorder, chronic: Secondary | ICD-10-CM | POA: Diagnosis not present

## 2022-02-23 DIAGNOSIS — F4312 Post-traumatic stress disorder, chronic: Secondary | ICD-10-CM | POA: Diagnosis not present

## 2022-03-02 DIAGNOSIS — F4312 Post-traumatic stress disorder, chronic: Secondary | ICD-10-CM | POA: Diagnosis not present

## 2022-03-11 DIAGNOSIS — F4312 Post-traumatic stress disorder, chronic: Secondary | ICD-10-CM | POA: Diagnosis not present

## 2022-03-16 DIAGNOSIS — F4312 Post-traumatic stress disorder, chronic: Secondary | ICD-10-CM | POA: Diagnosis not present

## 2022-03-17 DIAGNOSIS — F439 Reaction to severe stress, unspecified: Secondary | ICD-10-CM | POA: Diagnosis not present

## 2022-03-23 DIAGNOSIS — F4312 Post-traumatic stress disorder, chronic: Secondary | ICD-10-CM | POA: Diagnosis not present

## 2022-03-24 ENCOUNTER — Encounter: Payer: Self-pay | Admitting: Neurology

## 2022-03-24 ENCOUNTER — Ambulatory Visit: Payer: BC Managed Care – PPO | Admitting: Neurology

## 2022-03-24 VITALS — BP 95/63 | HR 74 | Ht 70.0 in | Wt 149.0 lb

## 2022-03-24 DIAGNOSIS — M542 Cervicalgia: Secondary | ICD-10-CM | POA: Diagnosis not present

## 2022-03-24 DIAGNOSIS — G40309 Generalized idiopathic epilepsy and epileptic syndromes, not intractable, without status epilepticus: Secondary | ICD-10-CM | POA: Diagnosis not present

## 2022-03-24 MED ORDER — LACOSAMIDE 200 MG PO TABS
200.0000 mg | ORAL_TABLET | Freq: Two times a day (BID) | ORAL | 5 refills | Status: DC
Start: 2022-03-24 — End: 2022-11-04

## 2022-03-24 MED ORDER — CYCLOBENZAPRINE HCL 5 MG PO TABS
5.0000 mg | ORAL_TABLET | Freq: Three times a day (TID) | ORAL | 6 refills | Status: DC | PRN
Start: 2022-03-24 — End: 2022-10-20

## 2022-03-24 NOTE — Patient Instructions (Signed)
Good to see you.  Continue Lacosamide 200mg  twice a day  2. Try Flexeril for neck pain, take 1 tablet at night and see how you feel. May take every 8 hours as needed if it does not make you drowsy in the daytime  3. Follow-up in 6 months, call for any changes   Seizure Precautions: 1. If medication has been prescribed for you to prevent seizures, take it exactly as directed.  Do not stop taking the medicine without talking to your doctor first, even if you have not had a seizure in a long time.   2. Avoid activities in which a seizure would cause danger to yourself or to others.  Don't operate dangerous machinery, swim alone, or climb in high or dangerous places, such as on ladders, roofs, or girders.  Do not drive unless your doctor says you may.  3. If you have any warning that you may have a seizure, lay down in a safe place where you can't hurt yourself.    4.  No driving for 6 months from last seizure, as per [facility].   Please refer to the following link on the [employer]ile[employer] of [employer]'s website for more information: http://www.epilepsyfoundation.org/answerplace/Social/driving/drivingu.cfm   5.  Maintain good sleep hygiene. Avoid alcohol.  6.  Notify your neurology if you are planning pregnancy or if you become pregnant.  7.  Contact your doctor if you have any problems that may be related to the medicine you are taking.  8.  Call 911 and bring the patient back to the ED if:        A.  The seizure lasts longer than 5 minutes.       B.  The patient doesn't awaken shortly after the seizure  C.  The patient has new problems such as difficulty seeing, speaking or moving  D.  The patient was injured during the seizure  E.  The patient has a temperature over 102 F (39C)  F.  The patient vomited and now is having trouble breathing

## 2022-03-24 NOTE — Progress Notes (Signed)
NEUROLOGY FOLLOW UP OFFICE NOTE  Regina Kerr [id number] [birth year]  HISTORY OF PRESENT ILLNESS: I had the pleasure of seeing Regina Kerr in follow-up in the neurology clinic on 03/24/2022. They are alone in the office today. 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 Since their last visit, they deny any seizures since October 2022. They have not needed the prn Nayzilam. They deny any staring/unresponsive episodes, gaps in time, olfactory/gustatory hallucinations, focal numbness/tingling/weakness, myoclonic jerks. No side effects on Lacosamide 200mg  BID. They are not having too much dizziness. For the past 2 months, they have noticed an increase in headaches. This has happened in the past where they would have intermittent bouts of headaches. They have them around 3 days a week, usually behind their eyes. No associated nausea/vomiting, photo/phonophobia. This week they woke up and could not turn their head with neck pain, when they turn their head, the back of the head hurts. No falls. Sleep is good. They work on the computer a lot and had new prescription glasses in the Fall. They still substitute words but they are not affecting work/school. They will be graduating in 4-5 years.     History on Initial Assessment 03/24/2021: This is a 26 year old right-handed female with a history of kidney stones, epilepsy, presenting to establish care. Records from Dr. 22, their epileptologist in [place] from 2017 to 2022, were reviewed, last visit was in 01/2021. At age 14, they started having episodes of staring, they could see and hear but could not understand and was answering gibberish. The first generalized convulsion occurred at age 26, they had woken up feeling unwell, very dizzy and lay on the couch, waking up to EMS around them. They were seen in the ER and started on seizure medication which they took until age 26. They were seizure-free off medication until they  had surgery at age 26 and seizures recurred. They recall episodes in 2018 where they would have "brain zaps" and feel "twitchy" but conscious, no loss of time, occurring 50-100 times before they went to bed. This lasted for the summer then stopped. 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 Prior ASMs included Keppra, Tegretol, Depakote, Topamax, Lamictal (rash), which caused mood changes or oversedation. They tolerated Vimpat but stopped it on their own. They estimated around 10 lifetime GTC seizures and states they have not had "real" seizures since age 18. Every time they have anesthesia, they have multiple reportedly convulsive type seizures coming out of anesthesia, last time this occurred was a few hours after hip surgery in April 2022 where they had 27 seizures. Last seizure was in June 2022 with an aura of body getting warm like a wave from feet to head and a swhoosh sound in their head. No loss of awareness but some confusion. Seizures usually occur before bedtime or early morning, triggered by poor sleep. They live with their partner who has described seizures starting with staring/unresponsivenes followed by a GTC. No focal weakness after, they usually have a headache.   They have been on Vimpat 200mg  BID. In October 2021 they were having significant nausea, dizziness "24/7" with trouble walking, that lasted until April 2022. Consideration for switching Vimpat was done, however they reported symptoms suddenly improved, dizziness is not as bad with a spinning sensation like the room is moving/tilting, now occurring sporadically 1-2 times a day lasting 15-60 minutes. They say they feel  it right now during today's visit. No associated headaches, focal numbness/tingling/weakness. No neck/back pain, bladder dysfunction. There is some constipation. Mood is good on mirtazapine 60mg  daily, sleep good with Trazodone 75mg   qhs. They drive. No pregnancy plans, s/p hysterectomy.   Epilepsy Risk Factors:  Brother had seizures secondary to brain tumor. Otherwise they had a normal birth and early development.  There is no history of febrile convulsions, CNS infections such as meningitis/encephalitis, significant traumatic brain injury, neurosurgical procedures.  Diagnostic Data: Prior MRI report unavailable for review, patient noted it may have showed an abnoramlity of swelling in the left frontal lobe but unclear. MRI brain 05/2020 with and without contrast unremarkable MRI brain 10/2015: unremarkable  EEG 10/2015 within normal limits. EEG 04/2018: single burst of irregular slowing with what appears to be intermixed spikes.   Prior ASMs: Keppra, Tegretol, Depakote, Topamax, Lamictal (rash)   PAST MEDICAL HISTORY: Past Medical History:  Diagnosis Date   Anemia    Congenital absence of one kidney    Depression    Pott's disease    Seizures (HCC)     MEDICATIONS: Current Outpatient Medications on File Prior to Visit  Medication Sig Dispense Refill   lacosamide (VIMPAT) 200 MG TABS tablet Take 1 tablet (200 mg total) by mouth 2 (two) times daily. 60 tablet 5   linaclotide (LINZESS) 72 MCG capsule Take 72 mcg by mouth daily before breakfast.     lubiprostone (AMITIZA) 8 MCG capsule Take 1 capsule (8 mcg total) by mouth daily with breakfast. 90 capsule 1   meloxicam (MOBIC) 15 MG tablet Take 1 tablet (15 mg total) by mouth daily. 90 tablet 3   Midazolam (NAYZILAM) 5 MG/0.1ML SOLN Administer one dose in nostril as needed for seizure. May use second dose in other nostril in another 10 minutes if seizure continues 5 each 5   mirtazapine (REMERON) 30 MG tablet Take 2 tablets (60 mg total) by mouth daily. 180 tablet 1   traZODone (DESYREL) 50 MG tablet Take 1.5 tablets (75 mg total) by mouth at bedtime. (Patient not taking: Reported on 09/20/2021) 135 tablet 3   No current facility-administered medications on file  prior to visit.    ALLERGIES: Allergies  Allergen Reactions   Amoxicillin     FAMILY HISTORY: Family History  Problem Relation Age of Onset   Hypertension Maternal Grandmother    Diabetes Maternal Grandmother     SOCIAL HISTORY: Social History   Socioeconomic History   Marital status: Significant Other    Spouse name: Not on file   Number of children: Not on file   Years of education: Not on file   Highest education level: Not on file  Occupational History   Not on file  Tobacco Use   Smoking status: Never   Smokeless tobacco: Never  Vaping Use   Vaping Use: Never used  Substance and Sexual Activity   Alcohol use: Never   Drug use: Never   Sexual activity: Not on file  Other Topics Concern   Not on file  Social History Narrative   Right handed    Social Determinants of Health   Financial Resource Strain: Not on file  Food Insecurity: Not on file  Transportation Needs: Not on file  Physical Activity: Not on file  Stress: Not on file  Social Connections: Not on file  Intimate Partner Violence: Not on file     PHYSICAL EXAM: Vitals:   03/24/22 0955  BP: 95/63  Pulse: 74  SpO2: 99%  General: No acute distress Head:  Normocephalic/atraumatic Skin/Extremities: No rash, no edema, no neck tenderness Neurological Exam: alert and awake. No aphasia or dysarthria. Fund of knowledge is appropriate.  Attention and concentration are normal.   Cranial nerves: Pupils equal, round. Extraocular movements intact with no nystagmus. Visual fields full.  No facial asymmetry.  Motor: Bulk and tone normal, muscle strength 5/5 throughout with no pronator drift.   Finger to nose testing intact.  Gait narrow-based and steady, able to tandem walk adequately.  Romberg negative.   IMPRESSION: This is a 26 yo RH female with a history of kidney stones with generalized epilepsy. EEG showed single burst of irregular generalized slowing with possible intermixed spikes. No seizures  since October 2022, continue Lacosamide 200mg  BID. They have prn Nayzilam for seizure rescue. They are reporting an increase in headaches with neck pain, possibly cervicogenic headaches. They will try prn Flexeril 5mg  every 8 hours as needed, side effects discussed. They are aware of [place] driving laws to stop driving after a seizure until 6 months seizure-free. Follow-up in 6 months, call for any changes.   Thank you for allowing me to participate in their care.  Please do not hesitate to call for any questions or concerns.   , M.D.   CC: Dr. 

## 2022-03-30 DIAGNOSIS — F4312 Post-traumatic stress disorder, chronic: Secondary | ICD-10-CM | POA: Diagnosis not present

## 2022-04-06 DIAGNOSIS — F4312 Post-traumatic stress disorder, chronic: Secondary | ICD-10-CM | POA: Diagnosis not present

## 2022-04-11 DIAGNOSIS — F4312 Post-traumatic stress disorder, chronic: Secondary | ICD-10-CM | POA: Diagnosis not present

## 2022-04-15 DIAGNOSIS — F4312 Post-traumatic stress disorder, chronic: Secondary | ICD-10-CM | POA: Diagnosis not present

## 2022-04-18 DIAGNOSIS — F4312 Post-traumatic stress disorder, chronic: Secondary | ICD-10-CM | POA: Diagnosis not present

## 2022-04-29 DIAGNOSIS — F4312 Post-traumatic stress disorder, chronic: Secondary | ICD-10-CM | POA: Diagnosis not present

## 2022-05-02 DIAGNOSIS — F4312 Post-traumatic stress disorder, chronic: Secondary | ICD-10-CM | POA: Diagnosis not present

## 2022-05-10 DIAGNOSIS — F4312 Post-traumatic stress disorder, chronic: Secondary | ICD-10-CM | POA: Diagnosis not present

## 2022-05-16 DIAGNOSIS — F4312 Post-traumatic stress disorder, chronic: Secondary | ICD-10-CM | POA: Diagnosis not present

## 2022-05-23 DIAGNOSIS — F411 Generalized anxiety disorder: Secondary | ICD-10-CM | POA: Diagnosis not present

## 2022-05-24 DIAGNOSIS — F4312 Post-traumatic stress disorder, chronic: Secondary | ICD-10-CM | POA: Diagnosis not present

## 2022-05-30 DIAGNOSIS — F4312 Post-traumatic stress disorder, chronic: Secondary | ICD-10-CM | POA: Diagnosis not present

## 2022-05-31 DIAGNOSIS — H52221 Regular astigmatism, right eye: Secondary | ICD-10-CM | POA: Diagnosis not present

## 2022-05-31 DIAGNOSIS — H5213 Myopia, bilateral: Secondary | ICD-10-CM | POA: Diagnosis not present

## 2022-06-01 ENCOUNTER — Ambulatory Visit: Payer: BC Managed Care – PPO | Admitting: Obstetrics and Gynecology

## 2022-06-01 ENCOUNTER — Encounter: Payer: Self-pay | Admitting: Obstetrics and Gynecology

## 2022-06-01 ENCOUNTER — Other Ambulatory Visit: Payer: Self-pay

## 2022-06-01 VITALS — BP 111/67 | HR 64 | Ht 70.0 in | Wt 145.3 lb

## 2022-06-01 DIAGNOSIS — N760 Acute vaginitis: Secondary | ICD-10-CM | POA: Diagnosis not present

## 2022-06-01 DIAGNOSIS — N83201 Unspecified ovarian cyst, right side: Secondary | ICD-10-CM | POA: Diagnosis not present

## 2022-06-01 DIAGNOSIS — M791 Myalgia, unspecified site: Secondary | ICD-10-CM | POA: Diagnosis not present

## 2022-06-01 MED ORDER — ESTRADIOL 0.1 MG/GM VA CREA: 2.0000 g | TOPICAL_CREAM | Freq: Every day | VAGINAL | 2 refills | Status: DC

## 2022-06-01 NOTE — Progress Notes (Addendum)
Subjective:  CC: ovarian cyst   Patient ID: Regina Kerr, adult    DOB: 1996/07/29, 26 y.o.   MRN: [id number]  Concerned about repeated ovarian pain. During hysterectomy they noted to have some cysts but didn't have endometriosis.  Episode of pelvic pain that lasted about 2 weeks and then felt a pop .  Constant achiness, no cramping pain, sharp pain that is just the right of midline of hte pelvis, nonradiating, nothing that makes the pain better or worse, no trigger ofr the pain, and will last typically last about 1-2 weeks, waxing and waining pain over the baseline achiness.   No urinary changes. Taking linzess prn for constipation which has been helping.   Within the last year when getting around will have burning sensation at the opening but has improved for the last 2 weeks. Will cause them to stop. No changes in lubricant being used during intercourse. No sex with a partner in the last 2-3 weeks. 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   2 hip surgeries for labrum tear and needed total reconstruction. 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 Last pap smear 1.5 years ago.   Image report from 2022 uploaded to media tab    Review of Systems  Constitutional:  Negative for chills and fever.  Respiratory:  Negative for shortness of breath.   Cardiovascular:  Negative for chest pain.  Gastrointestinal:  Positive for constipation. Negative for diarrhea, nausea and vomiting.  Genitourinary:  Negative for vaginal bleeding.  All other systems reviewed and are negative.      Objective:  BP 111/67   Pulse 64   Ht 5\' 10"  (1.778 m)   Wt 145 lb 4.8 oz (65.9 kg)   BMI 20.85 kg/m   Physical Exam Exam conducted with a chaperone present.  Constitutional:      General: Widmayer "Maxine Glenn" is not in acute distress.    Appearance: Normal  appearance.  HENT:     Head: Normocephalic and atraumatic.  Abdominal:     General: Bowel sounds are normal. There is no distension.     Palpations: Abdomen is soft. There is no mass.     Tenderness: There is abdominal tenderness. There is no guarding.     Comments: Point tenderness right suprapubic region, possible trigger point  Genitourinary:    General: Normal vulva.     Exam position: Supine.     Labia:        Right: No rash, tenderness or lesion.        Left: No rash, tenderness or lesion.      Urethra: No prolapse.     Vagina: Normal.     Cervix: No cervical motion tenderness, discharge, friability, lesion, erythema or cervical bleeding.     Comments: Normal vulvar sensation bilaterally Vestibule normal Allodynia present Anal wink reflex present No CMT Musculoskeletal:     Cervical back: Normal.     Thoracic back: Normal.     Lumbar back: Normal. No spasms, tenderness or bony tenderness. Normal range of motion. Negative right straight leg raise test and negative left straight leg raise test.     Right hip: Normal. Normal range of motion.     Left hip: Normal. Normal range of motion.     Right lower leg: Normal.     Left lower  leg: Normal.  Skin:    General: Skin is warm and dry.  Neurological:     General: No focal deficit present.     Mental Status: Calvert Cantor "Darol Destine" is alert and oriented to person, place, and time.  Psychiatric:        Mood and Affect: Mood normal.        Behavior: Behavior normal.        Thought Content: Thought content normal.        Judgment: Judgment normal.        Assessment & Plan:   1. Cyst of right ovary Repeat pelvic US If hemorrhagic cyst, consider ovulation suppression vs excision if endometrioma - US PELVIC COMPLETE WITH TRANSVAGINAL; Future  2. Myalgia Recommend pelvic PT, though pt admits schedule not likely to allow for sessions, can use online resources Possible trigger point injection at follow up  - Ambulatory  referral to Physical Therapy  3. Vestibulitis of vagina Vaginal estrogen 2 nights/week for vaginal introitus   Return in about 4 weeks (around 06/29/2022) for GYN follow up with Nevae Pinnix.  Allysen Lazo 06/01/22

## 2022-06-01 NOTE — Patient Instructions (Signed)
Use vaginal estrogen 2 nights a week for vaginal irritation/burning  Referral placed for pelvic physical therapy for when you have time  Ordered ultrasound to assess ovary

## 2022-06-06 DIAGNOSIS — F4312 Post-traumatic stress disorder, chronic: Secondary | ICD-10-CM | POA: Diagnosis not present

## 2022-06-13 DIAGNOSIS — F4312 Post-traumatic stress disorder, chronic: Secondary | ICD-10-CM | POA: Diagnosis not present

## 2022-06-20 DIAGNOSIS — F4312 Post-traumatic stress disorder, chronic: Secondary | ICD-10-CM | POA: Diagnosis not present

## 2022-06-23 DIAGNOSIS — F411 Generalized anxiety disorder: Secondary | ICD-10-CM | POA: Diagnosis not present

## 2022-06-27 DIAGNOSIS — F4312 Post-traumatic stress disorder, chronic: Secondary | ICD-10-CM | POA: Diagnosis not present

## 2022-06-28 ENCOUNTER — Ambulatory Visit
Admission: RE | Admit: 2022-06-28 | Discharge: 2022-06-28 | Disposition: A | Discharge status: Home / Self Care | Payer: BC Managed Care – PPO | Source: Ambulatory Visit | Attending: Obstetrics and Gynecology | Admitting: Obstetrics and Gynecology

## 2022-06-28 DIAGNOSIS — Z90710 Acquired absence of both cervix and uterus: Secondary | ICD-10-CM | POA: Diagnosis not present

## 2022-06-28 DIAGNOSIS — N83201 Unspecified ovarian cyst, right side: Secondary | ICD-10-CM | POA: Diagnosis not present

## 2022-07-04 DIAGNOSIS — F4312 Post-traumatic stress disorder, chronic: Secondary | ICD-10-CM | POA: Diagnosis not present

## 2022-07-11 DIAGNOSIS — F4312 Post-traumatic stress disorder, chronic: Secondary | ICD-10-CM | POA: Diagnosis not present

## 2022-07-13 ENCOUNTER — Ambulatory Visit: Payer: BC Managed Care – PPO | Admitting: Obstetrics and Gynecology

## 2022-07-18 DIAGNOSIS — F4312 Post-traumatic stress disorder, chronic: Secondary | ICD-10-CM | POA: Diagnosis not present

## 2022-07-19 DIAGNOSIS — F331 Major depressive disorder, recurrent, moderate: Secondary | ICD-10-CM | POA: Diagnosis not present

## 2022-07-19 DIAGNOSIS — F424 Excoriation (skin-picking) disorder: Secondary | ICD-10-CM | POA: Diagnosis not present

## 2022-07-25 DIAGNOSIS — F4312 Post-traumatic stress disorder, chronic: Secondary | ICD-10-CM | POA: Diagnosis not present

## 2022-08-03 DIAGNOSIS — F331 Major depressive disorder, recurrent, moderate: Secondary | ICD-10-CM | POA: Diagnosis not present

## 2022-08-04 DIAGNOSIS — D649 Anemia, unspecified: Secondary | ICD-10-CM | POA: Diagnosis not present

## 2022-08-04 DIAGNOSIS — F331 Major depressive disorder, recurrent, moderate: Secondary | ICD-10-CM | POA: Diagnosis not present

## 2022-08-05 DIAGNOSIS — F4312 Post-traumatic stress disorder, chronic: Secondary | ICD-10-CM | POA: Diagnosis not present

## 2022-08-17 DIAGNOSIS — F331 Major depressive disorder, recurrent, moderate: Secondary | ICD-10-CM | POA: Diagnosis not present

## 2022-08-17 DIAGNOSIS — F4312 Post-traumatic stress disorder, chronic: Secondary | ICD-10-CM | POA: Diagnosis not present

## 2022-08-17 DIAGNOSIS — F424 Excoriation (skin-picking) disorder: Secondary | ICD-10-CM | POA: Diagnosis not present

## 2022-08-23 ENCOUNTER — Ambulatory Visit (INDEPENDENT_AMBULATORY_CARE_PROVIDER_SITE_OTHER): Payer: BC Managed Care – PPO | Admitting: Sports Medicine

## 2022-08-23 VITALS — BP 118/76 | HR 76 | Ht 70.0 in | Wt 154.0 lb

## 2022-08-23 DIAGNOSIS — M67911 Unspecified disorder of synovium and tendon, right shoulder: Secondary | ICD-10-CM

## 2022-08-23 DIAGNOSIS — M67813 Other specified disorders of tendon, right shoulder: Secondary | ICD-10-CM

## 2022-08-23 DIAGNOSIS — G8929 Other chronic pain: Secondary | ICD-10-CM | POA: Diagnosis not present

## 2022-08-23 NOTE — Patient Instructions (Addendum)
Good to see you  As needed follow up if no improvement 3-4 week follow up

## 2022-08-23 NOTE — Progress Notes (Signed)
Regina Kerr D.[place] Phone: [phone]   Assessment and Plan:     1. Chronic right shoulder pain 2. Tendinopathy of right rotator cuff  -Chronic with exacerbation, subsequent sports medicine visit - Recurrence of right shoulder pain that is consistent with right shoulder pain treated on 11/15/2021 based on HPI and physical exam and likely related to rotator cuff - Patient received significant incomplete relief in symptoms after subacromial CSI at previous office visit, so elected for repeat CSI today - We discussed NSAID course, however decided to proceed with subacromial CSI at this time - Restart HEP  Procedure: Subacromial Injection Side: Right  Risks explained and consent was given verbally. The site was cleaned with alcohol prep. A steroid injection was performed from posterior approach using 74mL of 1% lidocaine without epinephrine and 7mL of kenalog 40mg /ml. This was well tolerated and resulted in symptomatic relief.  Needle was removed, hemostasis achieved, and post injection instructions were explained.   Pt was advised to call or return to clinic if these symptoms worsen or fail to improve as anticipated.   Pertinent previous records reviewed include none   Follow Up: As needed improvement or worsening of symptoms in 3 to 4 weeks.  Could consider advanced imaging versus physical therapy versus NSAID course   Subjective:   I, Regina Kerr, am serving as a Education administrator for Doctor Glennon Mac   Chief Complaint: right shoulder pain    HPI:  11/15/2021 Patient is a 27 year old female complaining of right shoulder pain. Patient states that they have a 100 join problems moved here from [place] last summer, injured right shoulder  playing volleyball and aerial silks 3-4 years ago has been to PT but the pain never went away. Is playing vb now and cant hit the ball overhand without pain in the front of  the shoulder , no numbness tingling , no radiating pain, has tried ib and tylenol and they do nothing to help, takes meloxicam daily has been on long term, decreased ROM   08/23/2022 Patient states that right shoulder flared about 2 weeks , no MOI, same symptoms     Relevant Historical Information: Congenital unilateral kidney  Additional pertinent review of systems negative.   Current Outpatient Medications:    lacosamide (VIMPAT) 200 MG TABS tablet, Take 1 tablet (200 mg total) by mouth 2 (two) times daily., Disp: 60 tablet, Rfl: 5   Midazolam (NAYZILAM) 5 MG/0.1ML SOLN, Administer one dose in nostril as needed for seizure. May use second dose in other nostril in another 10 minutes if seizure continues, Disp: 5 each, Rfl: 5   mirtazapine (REMERON) 30 MG tablet, Take 2 tablets (60 mg total) by mouth daily., Disp: 180 tablet, Rfl: 1   cyclobenzaprine (FLEXERIL) 5 MG tablet, Take 1 tablet (5 mg total) by mouth every 8 (eight) hours as needed for muscle spasms., Disp: 30 tablet, Rfl: 6   estradiol (ESTRACE VAGINAL) 0.1 MG/GM vaginal cream, Place 2 g vaginally daily., Disp: 42.5 g, Rfl: 2   Objective:     Vitals:   08/23/22 0959  BP: 118/76  Pulse: 76  SpO2: 99%  Weight: 154 lb (69.9 kg)  Height: 5\' 10"  (1.778 m)      Body mass index is 22.1 kg/m.    Physical Exam:    Gen: Appears well, nad, nontoxic and pleasant Neuro:sensation intact, strength is 5/5 with df/pf/inv/ev, muscle tone wnl Skin: no suspicious lesion  or defmority Psych: A&O, appropriate mood and affect  Right shoulder: no deformity, swelling or muscle wasting No scapular winging FF 180 with painful arc, abd 180, int 5 with painful arc, ext 90 TTP AC, deltoid NTTP over the [place], clavicle,   coracoid, biceps groove, humerus, deltoid,  , cervical spine Positive Hawking's, O'Brien's, Neer Neg   empty can,  , crossarm, subscap liftoff, speeds Neg ant drawer, sulcus sign, apprehension Negative Spurling's test  bilat FROM of neck    Electronically signed by:  Regina Kerr D.[facility] 11:00 AM 08/23/22

## 2022-08-25 DIAGNOSIS — F4312 Post-traumatic stress disorder, chronic: Secondary | ICD-10-CM | POA: Diagnosis not present

## 2022-08-29 DIAGNOSIS — F4312 Post-traumatic stress disorder, chronic: Secondary | ICD-10-CM | POA: Diagnosis not present

## 2022-09-05 DIAGNOSIS — F424 Excoriation (skin-picking) disorder: Secondary | ICD-10-CM | POA: Diagnosis not present

## 2022-09-05 DIAGNOSIS — F5104 Psychophysiologic insomnia: Secondary | ICD-10-CM | POA: Diagnosis not present

## 2022-09-05 DIAGNOSIS — F4312 Post-traumatic stress disorder, chronic: Secondary | ICD-10-CM | POA: Diagnosis not present

## 2022-09-05 DIAGNOSIS — F331 Major depressive disorder, recurrent, moderate: Secondary | ICD-10-CM | POA: Diagnosis not present

## 2022-09-13 DIAGNOSIS — F4312 Post-traumatic stress disorder, chronic: Secondary | ICD-10-CM | POA: Diagnosis not present

## 2022-09-22 DIAGNOSIS — F4312 Post-traumatic stress disorder, chronic: Secondary | ICD-10-CM | POA: Diagnosis not present

## 2022-09-27 DIAGNOSIS — F4312 Post-traumatic stress disorder, chronic: Secondary | ICD-10-CM | POA: Diagnosis not present

## 2022-10-03 DIAGNOSIS — F4312 Post-traumatic stress disorder, chronic: Secondary | ICD-10-CM | POA: Diagnosis not present

## 2022-10-04 ENCOUNTER — Encounter: Payer: Self-pay | Admitting: Neurology

## 2022-10-04 DIAGNOSIS — F4312 Post-traumatic stress disorder, chronic: Secondary | ICD-10-CM | POA: Diagnosis not present

## 2022-10-04 DIAGNOSIS — F331 Major depressive disorder, recurrent, moderate: Secondary | ICD-10-CM | POA: Diagnosis not present

## 2022-10-04 DIAGNOSIS — F424 Excoriation (skin-picking) disorder: Secondary | ICD-10-CM | POA: Diagnosis not present

## 2022-10-05 DIAGNOSIS — F332 Major depressive disorder, recurrent severe without psychotic features: Secondary | ICD-10-CM | POA: Diagnosis not present

## 2022-10-10 DIAGNOSIS — F4312 Post-traumatic stress disorder, chronic: Secondary | ICD-10-CM | POA: Diagnosis not present

## 2022-10-17 DIAGNOSIS — F424 Excoriation (skin-picking) disorder: Secondary | ICD-10-CM | POA: Diagnosis not present

## 2022-10-17 DIAGNOSIS — F4312 Post-traumatic stress disorder, chronic: Secondary | ICD-10-CM | POA: Diagnosis not present

## 2022-10-17 DIAGNOSIS — F331 Major depressive disorder, recurrent, moderate: Secondary | ICD-10-CM | POA: Diagnosis not present

## 2022-10-18 ENCOUNTER — Ambulatory Visit (INDEPENDENT_AMBULATORY_CARE_PROVIDER_SITE_OTHER): Payer: BC Managed Care – PPO | Admitting: Internal Medicine

## 2022-10-18 ENCOUNTER — Encounter: Payer: Self-pay | Admitting: Internal Medicine

## 2022-10-18 VITALS — BP 120/80 | HR 71 | Temp 98.3°F | Ht 70.0 in | Wt 138.0 lb

## 2022-10-18 DIAGNOSIS — R202 Paresthesia of skin: Secondary | ICD-10-CM | POA: Insufficient documentation

## 2022-10-18 DIAGNOSIS — R200 Anesthesia of skin: Secondary | ICD-10-CM | POA: Diagnosis not present

## 2022-10-18 LAB — COMPREHENSIVE METABOLIC PANEL
ALT: 13 U/L (ref 0–35)
AST: 18 U/L (ref 0–37)
Albumin: 4.6 g/dL (ref 3.5–5.2)
Alkaline Phosphatase: 68 U/L (ref 39–117)
BUN: 13 mg/dL (ref 6–23)
CO2: 26 mEq/L (ref 19–32)
Calcium: 10.1 mg/dL (ref 8.4–10.5)
Chloride: 104 mEq/L (ref 96–112)
Creatinine, Ser: 0.81 mg/dL (ref 0.40–1.20)
GFR: 99.55 mL/min (ref >60.00)
Glucose, Bld: 82 mg/dL (ref 70–99)
Potassium: 4.2 mEq/L (ref 3.5–5.1)
Sodium: 140 mEq/L (ref 135–145)
Total Bilirubin: 0.3 mg/dL (ref 0.2–1.2)
Total Protein: 7.7 g/dL (ref 6.0–8.3)

## 2022-10-18 LAB — T4, FREE: Free T4: 0.76 ng/dL (ref 0.60–1.60)

## 2022-10-18 LAB — HEMOGLOBIN A1C: Hgb A1c MFr Bld: 5.3 % (ref 4.6–6.5)

## 2022-10-18 LAB — CORTISOL: Cortisol, Plasma: 5.1 ug/dL

## 2022-10-18 LAB — CBC
HCT: 42.5 % (ref 36.0–46.0)
Hemoglobin: 14.4 g/dL (ref 12.0–15.0)
MCHC: 34 g/dL (ref 30.0–36.0)
MCV: 92.1 fl (ref 78.0–100.0)
Platelets: 283 10*3/uL (ref 150.0–400.0)
RBC: 4.61 Mil/uL (ref 3.87–5.11)
RDW: 12.8 % (ref 11.5–15.5)
WBC: 4.9 10*3/uL (ref 4.0–10.5)

## 2022-10-18 LAB — FERRITIN: Ferritin: 67 ng/mL (ref 10.0–291.0)

## 2022-10-18 LAB — TSH: TSH: 1.17 u[IU]/mL (ref 0.35–5.50)

## 2022-10-18 LAB — VITAMIN D 25 HYDROXY (VIT D DEFICIENCY, FRACTURES): VITD: 26.65 ng/mL — ABNORMAL LOW (ref 30.00–100.00)

## 2022-10-18 LAB — VITAMIN B12: Vitamin B-12: 251 pg/mL (ref 211–911)

## 2022-10-18 LAB — MAGNESIUM: Magnesium: 2 mg/dL (ref 1.5–2.5)

## 2022-10-18 NOTE — Assessment & Plan Note (Signed)
Concerning symptoms. Checking CBC, CMP, magnesium, TSH, free T4, HgA1c, ferritin, B12, vitamin D, cortisol. Needs MRI brain given recent symptoms with leg and arm weakness which took >24 hours to resolve. Also new explained 20 pound weight loss.

## 2022-10-18 NOTE — Progress Notes (Signed)
   Subjective:   Patient ID: Regina Kerr, adult    DOB: 06-28-1996, 27 y.o.   MRN: [id number]  Dizziness Associated symptoms include headaches, numbness and weakness. Pertinent negatives include no abdominal pain, chest pain, coughing, nausea or vomiting.   The patient is a 27 YO coming in for several concerning symptoms. Over last 1-2 months has lost close to 20 pounds. Has seizure disorder and had seizure episode in Jan which stopped with medication and has not recurred. Started effexor but timeline not consistent with symptoms. New numbness and weakness in legs unable to walk on Sunday. Almost went to ER but did not due to cost. Monday right leg weak and tingling and barely able to lift. Today more normal sensation and strength. Also having some skipped beats 1 every 2-3 weeks. Some episodes at night time with stopped breathing 2-3 times in the last week. Recent labs before onset of symptoms normal with CBC, CMP, vitamin d, thyroid. No otc meds or other changes.   Review of Systems  Constitutional:  Positive for unexpected weight change. Negative for activity change and appetite change.  HENT: Negative.    Eyes: Negative.   Respiratory:  Negative for cough, chest tightness and shortness of breath.   Cardiovascular:  Negative for chest pain, palpitations and leg swelling.  Gastrointestinal:  Negative for abdominal distention, abdominal pain, constipation, diarrhea, nausea and vomiting.  Genitourinary:  Positive for difficulty urinating.       Clear vaginal discharge at times  Musculoskeletal: Negative.   Skin: Negative.   Neurological:  Positive for dizziness, seizures, weakness, numbness and headaches.  Psychiatric/Behavioral: Negative.      Objective:  Physical Exam  Vitals:   10/18/22 1328  BP: 120/80  Pulse: 71  Temp: 98.3 F (36.8 C)  TempSrc: Oral  SpO2: 98%  Weight: 138 lb (62.6 kg)  Height: 5' 10"$  (1.778 m)    Assessment & Plan:  Visit time 20 minutes in face  to face communication with patient and coordination of care, additional 10 minutes spent in record review, coordination or care, ordering tests, communicating/referring to other healthcare professionals, documenting in medical records all on the same day of the visit for total time 30 minutes spent on the visit.

## 2022-10-19 ENCOUNTER — Ambulatory Visit
Admission: RE | Admit: 2022-10-19 | Discharge: 2022-10-19 | Disposition: A | Discharge status: Home / Self Care | Payer: BC Managed Care – PPO | Source: Ambulatory Visit | Attending: Internal Medicine | Admitting: Internal Medicine

## 2022-10-19 DIAGNOSIS — R29818 Other symptoms and signs involving the nervous system: Secondary | ICD-10-CM | POA: Diagnosis not present

## 2022-10-19 DIAGNOSIS — R200 Anesthesia of skin: Secondary | ICD-10-CM

## 2022-10-20 ENCOUNTER — Other Ambulatory Visit: Payer: BC Managed Care – PPO

## 2022-10-20 ENCOUNTER — Ambulatory Visit (INDEPENDENT_AMBULATORY_CARE_PROVIDER_SITE_OTHER): Payer: BC Managed Care – PPO | Admitting: Neurology

## 2022-10-20 ENCOUNTER — Encounter: Payer: Self-pay | Admitting: Neurology

## 2022-10-20 VITALS — BP 117/81 | HR 84 | Ht 70.0 in | Wt 140.8 lb

## 2022-10-20 DIAGNOSIS — R531 Weakness: Secondary | ICD-10-CM

## 2022-10-20 DIAGNOSIS — G40309 Generalized idiopathic epilepsy and epileptic syndromes, not intractable, without status epilepticus: Secondary | ICD-10-CM

## 2022-10-20 DIAGNOSIS — R252 Cramp and spasm: Secondary | ICD-10-CM | POA: Diagnosis not present

## 2022-10-20 NOTE — Progress Notes (Signed)
NEUROLOGY FOLLOW UP OFFICE NOTE  Regina Kerr:[id number] 02-Jul-1996  HISTORY OF PRESENT ILLNESS: I had the pleasure of seeing Regina Kerr in follow-up in the neurology clinic on 10/20/2022.  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 They are alone in the office today. Records and images were personally reviewed where available.  They contacted our office earlier this month about seizures after starting low dose Effexor XR. It was started at the end of December. On 2/7, they had around 20 small seizures. After using Nayzilam, the seizures stopped. On follow-up with PCP 2 days ago, they reported numbness and weakness in both legs, unable to walk last Sunday. On Monday, right leg was weak and tingling, they were barely able to lift it. When Dr. Sharlet Salina examined them, leg strength and sensation were normal. They also reported unintentional 20-lb weight loss. CBC, CMP, TSH, ferritin, cortisol, HbA1c normal. B12 low normal 251, and they were started on B12 supplements. Vitamin D level also low 26.65. I personally reviewed brain MRI without contrast done yesterday which was normal.  On review of events, they woke up Saturday night with burning sharp pain in both legs, unable to sleep with trouble moving their legs. When they got up on Sunday, they could not walk, right leg was weak and shaky, feeling like it would collapse when stepped on. They had to put a lot more effort to move it. The sharp burning sensation had subsided but still felt sore. No neck or back pain. They lay down all day then noticed left arm and shoulder were tingling for an hour. On Monday, they could walk again but still felt shaky and weak. Yesterday, there was some numbness on the right 1st and 2nd digits up the wrist and forearm. Now it is mostly the right side of her body feeling shaky and really weak. It feels like the muscles are "dissolving," so tight where they are unable to control them. No  recent falls or heavy lifting. For the past 2 months, they have had trouble urinating with some urgency and hesitancy. They are usually constipated but recently more regular. No perineal numbness. No abdominal pain, nausea/vomiting, there is occasional left flank pain. No difficulty swallowing. There is some double vision. No recent infections or sick contacts. No further seizures since 2/7, the seizures were more like "brain zaps" where for a second they had no idea what was going on. It was back to back all day, no unresponsiveness, no convulsive activity. They continue on Lacosamide '200mg'$  BID, no side effects. They have had more dizziness since this started, it felt like they were rocking during the MRI yesterday. They have been having an increase in headaches, twice a week at the end of 2023, then once a week this month. Sleep is good, 8-10 hours at night. Effexor dose was increased to '75mg'$  3 weeks ago. They deny any change in work stress. They did have a break up at the beginning of the month and still live together.    History on Initial Assessment 03/24/2021: This is a 27 year old right-handed female with a history of kidney stones, epilepsy, presenting to establish care. Records from Dr. Marcello Moores, their epileptologist in [place] from 2017 to 2022, were reviewed, last visit was in 01/2021. At age 27, they started having episodes of staring, they could see and hear but could not understand and was answering gibberish. The first generalized convulsion occurred at age 27, they had woken up  feeling unwell, very dizzy and lay on the couch, waking up to EMS around them. They were seen in the ER and started on seizure medication which they took until age 27. They were seizure-free off medication until they had surgery at age 10 and seizures recurred. They recall episodes in 2018 where they would have "brain zaps" and feel "twitchy" but conscious, no loss of time, occurring 50-100 times before they went to bed. This  lasted for the summer then stopped. 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 Prior ASMs included Keppra, Tegretol, Depakote, Topamax, Lamictal (rash), which caused mood changes or oversedation. They tolerated Vimpat but stopped it on their own. They estimated around 10 lifetime GTC seizures and states they have not had "real" seizures since age 4. Every time they have anesthesia, they have multiple reportedly convulsive type seizures coming out of anesthesia, last time this occurred was a few hours after hip surgery in April 2022 where they had 7 seizures. Last seizure was in June 2022 with an aura of body getting warm like a wave from feet to head and a swhoosh sound in their head. No loss of awareness but some confusion. Seizures usually occur before bedtime or early morning, triggered by poor sleep. They live with their partner who has described seizures starting with staring/unresponsivenes followed by a GTC. No focal weakness after, they usually have a headache.   They have been on Vimpat '200mg'$  BID. In October 2021 they were having significant nausea, dizziness "24/7" with trouble walking, that lasted until April 2022. Consideration for switching Vimpat was done, however they reported symptoms suddenly improved, dizziness is not as bad with a spinning sensation like the room is moving/tilting, now occurring sporadically 1-2 times a day lasting 15-60 minutes. They say they feel it right now during today's visit. No associated headaches, focal numbness/tingling/weakness. No neck/back pain, bladder dysfunction. There is some constipation. Mood is good on mirtazapine '60mg'$  daily, sleep good with Trazodone '75mg'$  qhs. They drive. No pregnancy plans, s/p hysterectomy.   Epilepsy Risk Factors:  Brother had seizures secondary to brain tumor. Otherwise they had a normal birth and early development.  There is no history of  febrile convulsions, CNS infections such as meningitis/encephalitis, significant traumatic brain injury, neurosurgical procedures.  Diagnostic Data: Prior MRI report unavailable for review, patient noted it may have showed an abnoramlity of swelling in the left frontal lobe but unclear. MRI brain 05/2020 with and without contrast unremarkable MRI brain 10/2015: unremarkable  EEG 10/2015 within normal limits. EEG 04/2018: single burst of irregular slowing with what appears to be intermixed spikes.   Prior ASMs: Keppra, Tegretol, Depakote, Topamax, Lamictal (rash)   PAST MEDICAL HISTORY: Past Medical History:  Diagnosis Date   Anemia    Congenital absence of one kidney    Depression    Pott's disease    Seizures (HCC)     MEDICATIONS: Current Outpatient Medications on File Prior to Visit  Medication Sig Dispense Refill   lacosamide (VIMPAT) 200 MG TABS tablet Take 1 tablet (200 mg total) by mouth 2 (two) times daily. 60 tablet 5   Midazolam (NAYZILAM) 5 MG/0.1ML SOLN Administer one dose in nostril as needed for seizure. May use second dose in other nostril in another 10 minutes if seizure continues 5 each 5   mirtazapine (REMERON) 30 MG tablet Take 2 tablets (60 mg total) by mouth daily. 180 tablet 1   venlafaxine XR (  EFFEXOR-XR) 75 MG 24 hr capsule Take 75 mg by mouth daily with breakfast.     No current facility-administered medications on file prior to visit.    ALLERGIES: Allergies  Allergen Reactions   Amoxicillin     FAMILY HISTORY: Family History  Problem Relation Age of Onset   Hypertension Maternal Grandmother    Diabetes Maternal Grandmother     SOCIAL HISTORY: Social History   Socioeconomic History   Marital status: Single    Spouse name: Not on file   Number of children: Not on file   Years of education: Not on file   Highest education level: Not on file  Occupational History   Not on file  Tobacco Use   Smoking status: Never   Smokeless tobacco:  Never  Vaping Use   Vaping Use: Never used  Substance and Sexual Activity   Alcohol use: Never   Drug use: Never   Sexual activity: Not on file  Other Topics Concern   Not on file  Social History Narrative   Right handed    Lives with partner in a two level home   Caffeine none   Social Determinants of Health   Financial Resource Strain: Not on file  Food Insecurity: Not on file  Transportation Needs: Not on file  Physical Activity: Not on file  Stress: Not on file  Social Connections: Not on file  Intimate Partner Violence: Not on file     PHYSICAL EXAM: Vitals:   10/20/22 0948  BP: 117/81  Pulse: 84  SpO2: 99%   General: No acute distress Head:  Normocephalic/atraumatic Skin/Extremities: No rash, no edema Neurological Exam: alert and awake. No aphasia or dysarthria. Fund of knowledge is appropriate.  Attention and concentration are normal.   Cranial nerves: Pupils equal, round. Reports slight horizontal diplopia on primary gaze. Extraocular movements intact with no nystagmus. Visual fields full. Facial sensation intact. No facial asymmetry.  Motor: Increased tone on right UE and LE. Muscle strength 5/5 on left UE and LE, 5/5 proximal right UE, 3+/5 right finger flexors, wrist extension with some giveway weakness. 5/5 right hip flexion, 4/5 right knee flexion/extension, foot dorsi/plantarflexion with some giveway weakness. Sensation decreased to cold and pin on right UE, increased cold on right LE, intact pin on LE. Decreased vibration sense to ankles bilaterally. Reflexes brisk throughout with +Hoffman sign on right. No ankle clonus. Upgoing toe on right, mute on left. Finger to nose testing intact.  Gait narrow-based, favoring right leg due to weakness. Able to tandem walk. Negative Romberg. There are irregular jerky movements of the right hand and right leg/foot.    IMPRESSION: This is a 27 yo RH female with a history of kidney stones with generalized epilepsy. EEG showed  single burst of irregular generalized slowing with possible intermixed spikes. They had been doing well seizure-free for over a year until a cluster of seizures on 09/28/22 with good response to prn Nayzilam. Continue Lacosamide '200mg'$  BID. They present with new symptoms of sudden onset paresthesias with weakness, worse on the right side. Exam today shows irregular jerky movements of the right arm and leg, there is some giveway weakness, with sensory changes on the right and brisk reflexes. Etiology of symptoms unclear. Brain MRI no acute changes. We discussed doing MRI cervical spine with and without contrast to assess for underlying structural abnormality. EMG/NCV of the right upper and lower extremity will be ordered. Bloodwork for CK, aldolase, and autoimmune panel will be done today. Less likely  seizure, however we will do an EEG for completion. Follow-up after tests, call for any changes.    Thank you for allowing me to participate in their care.  Please do not hesitate to call for any questions or concerns.    Regina Kerr, M.D.   CC: Dr. Sharlet Salina

## 2022-10-20 NOTE — Patient Instructions (Signed)
We'll try to figure this out. Schedule the following tests:  Bloodwork for CK, aldolase, [facility] autoimmune panel  2. Schedule cervical spine MRI with and without contrast  3. Schedule EMG/NCV of the right arm and leg  4. Schedule 1-hour EEG  5. Continue Lacosamide '200mg'$  twice a day  6. Follow-up in 3 weeks, call for any changes   Seizure Precautions: 1. If medication has been prescribed for you to prevent seizures, take it exactly as directed.  Do not stop taking the medicine without talking to your doctor first, even if you have not had a seizure in a long time.   2. Avoid activities in which a seizure would cause danger to yourself or to others.  Don't operate dangerous machinery, swim alone, or climb in high or dangerous places, such as on ladders, roofs, or girders.  Do not drive unless your doctor says you may.  3. If you have any warning that you may have a seizure, lay down in a safe place where you can't hurt yourself.    4.  No driving for 6 months from last seizure, as per [facility].   Please refer to the following link on the [facility] website for more information: http://www.epilepsyfoundation.org/answerplace/Social/driving/drivingu.cfm   5.  Maintain good sleep hygiene. Avoid alcohol.  6.  Notify your neurology if you are planning pregnancy or if you become pregnant.  7.  Contact your doctor if you have any problems that may be related to the medicine you are taking.  8.  Call 911 and bring the patient back to the ED if:        A.  The seizure lasts longer than 5 minutes.       B.  The patient doesn't awaken shortly after the seizure  C.  The patient has new problems such as difficulty seeing, speaking or moving  D.  The patient was injured during the seizure  E.  The patient has a temperature over 102 F (39C)  F.  The patient vomited and now is having trouble breathing

## 2022-10-21 ENCOUNTER — Ambulatory Visit: Payer: BC Managed Care – PPO | Admitting: Internal Medicine

## 2022-10-21 ENCOUNTER — Ambulatory Visit (INDEPENDENT_AMBULATORY_CARE_PROVIDER_SITE_OTHER): Payer: BC Managed Care – PPO | Admitting: Neurology

## 2022-10-21 DIAGNOSIS — R252 Cramp and spasm: Secondary | ICD-10-CM

## 2022-10-21 DIAGNOSIS — R531 Weakness: Secondary | ICD-10-CM

## 2022-10-21 DIAGNOSIS — G40309 Generalized idiopathic epilepsy and epileptic syndromes, not intractable, without status epilepticus: Secondary | ICD-10-CM | POA: Diagnosis not present

## 2022-10-21 NOTE — Progress Notes (Unsigned)
EEG complete - results pending 

## 2022-10-24 ENCOUNTER — Ambulatory Visit: Payer: BC Managed Care – PPO | Admitting: Internal Medicine

## 2022-10-24 LAB — ALDOLASE: Aldolase: 4.7 U/L (ref <=8.1)

## 2022-10-26 NOTE — Procedures (Signed)
ELECTROENCEPHALOGRAM REPORT  Date of Study: 10/21/2022  Patient's Name: Regina Kerr MRN: [id number] Date of Birth: [birth year]  Referring Provider: Dr. Ellouise Newer  Clinical History: This is a 27 year old female with a history of seizures and new onset jerking, weakness of right side. EEG for classification.  Medications: Lacosamide, mirtazapine, venlafaxine  Technical Summary: A multichannel digital 1-hour EEG recording measured by the international 10-20 system with electrodes applied with paste and impedances below 5000 ohms performed in our laboratory with EKG monitoring in an awake and drowsy patient.  Hyperventilation and photic stimulation were performed.  The digital EEG was referentially recorded, reformatted, and digitally filtered in a variety of bipolar and referential montages for optimal display.    Description: The patient is awake and drowsy during the recording.  During maximal wakefulness, there is a symmetric, medium voltage 10 Hz posterior dominant rhythm that attenuates with eye opening.  The record is symmetric.  During drowsiness, there is an increase in theta slowing of the background.  Sleep was not captured. Hyperventilation did not elicit any EEG abnormalities. After hyperventilation, patient has pronation-supination movements of the left arm with no EEG correlate. Photic stimulation did not elicit any EEG abnormalities. During IPS at 5 Hz, there is irregular slight movements of the left arm with no EEG correlate.  There were no epileptiform discharges or electrographic seizures seen.    EKG lead was unremarkable.  Impression: This 1-hour awake and asleep EEG is normal.    Clinical Correlation: A normal EEG does not exclude a clinical diagnosis of epilepsy. Left arm movements, pronation-supination movements did not show any EEG correlate indicating these are non-epileptic. If further clinical questions remain, prolonged EEG may be helpful.  Clinical  correlation is advised.   Ellouise Newer, M.D.

## 2022-10-28 ENCOUNTER — Ambulatory Visit (INDEPENDENT_AMBULATORY_CARE_PROVIDER_SITE_OTHER): Payer: BC Managed Care – PPO | Admitting: Neurology

## 2022-10-28 DIAGNOSIS — G40309 Generalized idiopathic epilepsy and epileptic syndromes, not intractable, without status epilepticus: Secondary | ICD-10-CM

## 2022-10-28 DIAGNOSIS — R531 Weakness: Secondary | ICD-10-CM

## 2022-10-28 DIAGNOSIS — R252 Cramp and spasm: Secondary | ICD-10-CM

## 2022-10-28 NOTE — Procedures (Signed)
[facility]  [address]  [place], [place] [address] Tel: [phone] Fax: [phone] Test Date:  10/28/2022  Patient: Regina Kerr DOB: 01/06/1996 Physician: Narda Amber, DO  Sex: Female Height: '5\' 10"'$  Ref Phys: Ellouise Newer, MD  ID#: [id number]   Technician:    History: This is a 27 year-old female referred for evaluation of right sided weakness.  NCV & EMG Findings: Extensive electrodiagnostic testing of the right upper and lower extremities shows: All sensory responses including the right median, ulnar, mixed palmer, sural, and superficial peroneal nerves are within normal limits. All motor responses including the right median, ulnar, tibial, and peroneal nerves are within normal limits. Right tibial H-reflex study is within normal limits.  There is no evidence of active or chronic motor axon loss changes affecting any of the tested muscles.  There is a global pattern of incomplete motor unit activation as seen by variable motor recruitment, which may be due to poor effort, pain, and less likely central disorder of motor unit control.  Impression: This is a normal study of the right upper and lower extremities.  Testing was hampered somewhat by incomplete motor unit activation.    ___________________________ Narda Amber, DO    Nerve Conduction Studies   Stim Site NR Peak (ms) Norm Peak (ms) O-P Amp (V) Norm O-P Amp  Right Median Anti Sensory (2nd Digit)  32 C  Wrist    2.9 <3.3 82.1 >20  Right Sup Peroneal Anti Sensory (Ant Lat Mall)  32 C  12 cm    2.8 <4.4 12.8 >6  Right Sural Anti Sensory (Lat Mall)  32 C  Calf    3.1 <4.4 11.2 >6  Right Ulnar Anti Sensory (5th Digit)  32 C  Wrist    3.0 <3.0 55.4 >18     Stim Site NR Onset (ms) Norm Onset (ms) O-P Amp (mV) Norm O-P Amp Site1 Site2 Delta-0 (ms) Dist (cm) Vel (m/s) Norm Vel (m/s)  Right Median Motor (Abd Poll Brev)  32 C  Wrist    2.9 <3.9 14.5 >6 Elbow Wrist 4.4 28.0 64 >51   Elbow    7.3  14.3         Right Peroneal Motor (Ext Dig Brev)  32 C  Ankle    4.4 <5.5 6.0 >3 B Fib Ankle 6.7 38.0 57 >41  B Fib    11.1  5.9  Poplt B Fib 1.6 8.0 50 >41  Poplt    12.7  5.7         Right Tibial Motor (Abd Hall Brev)  32 C  Ankle    4.8 <5.8 15.6 >8 Knee Ankle 7.6 44.0 58 >41  Knee    12.4  15.2         Right Ulnar Motor (Abd Dig Minimi)  32 C  Wrist    2.6 <3.0 10.8 >8 B Elbow Wrist 3.8 20.0 53 >51  B Elbow    6.4  10.2  A Elbow B Elbow 1.2 7.0 58 >51  A Elbow    7.6  10.1            Stim Site NR Peak (ms) Norm Peak (ms) P-T Amp (V) Site1 Site2 Delta-P (ms) Norm Delta (ms)  Right Median/Ulnar Palm Comparison (Wrist - 8cm)  32 C  Median Palm    1.6 <2.2 87.2 Median Palm Ulnar Palm 0.1   Ulnar Palm    1.7 <2.2 27.8  Electromyography   Side Muscle Ins.Act Fibs Fasc Recrt Amp Dur Poly Activation Comment  Right 1stDorInt Nml Nml Nml *1- Nml Nml Nml *Variable N/A  Right Abd Poll Brev Nml Nml Nml *1- Nml Nml Nml *Variable N/A  Right PronatorTeres Nml Nml Nml *1- Nml Nml Nml *Variable N/A  Right Biceps Nml Nml Nml *1- Nml Nml Nml *Variable N/A  Right Triceps Nml Nml Nml *1- Nml Nml Nml *Variable N/A  Right Deltoid Nml Nml Nml *1- Nml Nml Nml *Variable N/A  Right AntTibialis Nml Nml Nml *1- Nml Nml Nml *Variable N/A  Right Gastroc Nml Nml Nml *1- Nml Nml Nml *Variable N/A  Right Flex Dig Long Nml Nml Nml *1- Nml Nml Nml *Variable N/A  Right RectFemoris Nml Nml Nml *1- Nml Nml Nml *Variable N/A  Right GluteusMed Nml Nml Nml *1- Nml Nml Nml *Variable N/A      Waveforms:

## 2022-10-31 DIAGNOSIS — F4312 Post-traumatic stress disorder, chronic: Secondary | ICD-10-CM | POA: Diagnosis not present

## 2022-11-03 ENCOUNTER — Other Ambulatory Visit: Payer: BC Managed Care – PPO

## 2022-11-04 ENCOUNTER — Other Ambulatory Visit: Payer: Self-pay

## 2022-11-04 ENCOUNTER — Encounter: Payer: BC Managed Care – PPO | Admitting: Neurology

## 2022-11-04 MED ORDER — LACOSAMIDE 200 MG PO TABS: 200.0000 mg | ORAL_TABLET | Freq: Two times a day (BID) | ORAL | 2 refills | Status: DC

## 2022-11-04 NOTE — Telephone Encounter (Signed)
MEDICATION: lacosamide (VIMPAT) 200 MG TABS tablet   PHARMACY: [facility] - ADDRESS: [address]   Comments: Patient is completely out.   **Let patient know to contact pharmacy at the end of the day to make sure medication is ready. **  ** Please notify patient to allow 48-72 hours to process**  **Encourage patient to contact the pharmacy for refills or they can request refills through [facility]**

## 2022-11-07 ENCOUNTER — Other Ambulatory Visit: Payer: BC Managed Care – PPO

## 2022-11-07 DIAGNOSIS — F4312 Post-traumatic stress disorder, chronic: Secondary | ICD-10-CM | POA: Diagnosis not present

## 2022-11-08 ENCOUNTER — Other Ambulatory Visit: Payer: Self-pay | Admitting: Anesthesiology

## 2022-11-08 MED ORDER — LACOSAMIDE 200 MG PO TABS: 200.0000 mg | ORAL_TABLET | Freq: Two times a day (BID) | ORAL | 5 refills | Status: DC

## 2022-11-08 NOTE — Telephone Encounter (Signed)
Pt called stating that her Rx for lacosamide was sent to the wrong pharmacy. [facility] to [facility] on [address] [place]

## 2022-11-14 DIAGNOSIS — F4312 Post-traumatic stress disorder, chronic: Secondary | ICD-10-CM | POA: Diagnosis not present

## 2022-11-15 ENCOUNTER — Encounter: Payer: Self-pay | Admitting: Neurology

## 2022-11-15 ENCOUNTER — Ambulatory Visit (INDEPENDENT_AMBULATORY_CARE_PROVIDER_SITE_OTHER): Payer: BC Managed Care – PPO | Admitting: Neurology

## 2022-11-15 VITALS — BP 120/81 | HR 82 | Ht 70.0 in | Wt 142.2 lb

## 2022-11-15 DIAGNOSIS — R531 Weakness: Secondary | ICD-10-CM

## 2022-11-15 DIAGNOSIS — G40309 Generalized idiopathic epilepsy and epileptic syndromes, not intractable, without status epilepticus: Secondary | ICD-10-CM | POA: Diagnosis not present

## 2022-11-15 DIAGNOSIS — R292 Abnormal reflex: Secondary | ICD-10-CM

## 2022-11-15 MED ORDER — LACOSAMIDE 200 MG PO TABS: 200.0000 mg | ORAL_TABLET | Freq: Two times a day (BID) | ORAL | 3 refills | Status: DC

## 2022-11-15 MED ORDER — NAYZILAM 5 MG/0.1ML NA SOLN: NASAL | 5 refills | Status: DC

## 2022-11-15 NOTE — Patient Instructions (Addendum)
Good to see you doing better. We will work on getting the MRI cervical spine with and without contrast approved. Continue Lacosamide 200mg  twice a day. Refills sent for Lacosamide and Nayzilam. Follow-up in 4 months, call for any changes.   Seizure Precautions: 1. If medication has been prescribed for you to prevent seizures, take it exactly as directed.  Do not stop taking the medicine without talking to your doctor first, even if you have not had a seizure in a long time.   2. Avoid activities in which a seizure would cause danger to yourself or to others.  Don't operate dangerous machinery, swim alone, or climb in high or dangerous places, such as on ladders, roofs, or girders.  Do not drive unless your doctor says you may.  3. If you have any warning that you may have a seizure, lay down in a safe place where you can't hurt yourself.    4.  No driving for 6 months from last seizure, as per [facility].   Please refer to the following link on the [facility] website for more information: http://www.epilepsyfoundation.org/answerplace/Social/driving/drivingu.cfm   5.  Maintain good sleep hygiene. Avoid alcohol.  6.  Contact your doctor if you have any problems that may be related to the medicine you are taking.  7.  Call 911 and bring the patient back to the ED if:        A.  The seizure lasts longer than 5 minutes.       B.  The patient doesn't awaken shortly after the seizure  C.  The patient has new problems such as difficulty seeing, speaking or moving  D.  The patient was injured during the seizure  E.  The patient has a temperature over 102 F (39C)  F.  The patient vomited and now is having trouble breathing

## 2022-11-15 NOTE — Progress Notes (Signed)
NEUROLOGY FOLLOW UP OFFICE NOTE  Regina Kerr [id number] [birth year]  HISTORY OF PRESENT ILLNESS: I had the pleasure of seeing Regina Kerr in follow-up in the neurology clinic on 11/15/2022.  The patient was last seen 3 weeks ago for new onset symptoms that occurred late February where they reported numbness and weakness in both legs, worse on the right. They reported right-sided shakiness and weakness. Exam showed right-sided weakness and irregular jerky movements on the right side. She had brisk reflexes, more on the right. MRI brain without contrast normal. Her EEG was normal, during the EEG they had left arm pronation-supination movements with no correlate. EMG/NCV of the right arm and leg were normal. Bloodwork was negative for an autoimmune process. MRI cervical spine was not approved by insurance.  They present today reporting the right side is a lot better for the past 2 weeks. They are generally fatigued and tired, but no further tingling or weakness where they are unable to walk. They played volleyball recently. Sometimes it takes more effort to move the right hand, but this is not on a daily basis. They report a seizure on 3/20, they woke up from sleep thinking they needed to use the bathroom, then got there felling tingly. They went back to bed and called to their roommate who witnessed a convulsion. Nayzilam was administered which stopped seizure within 30 seconds. No tongue bite or incontinence. They had run out of Lacosamide for 3-4 days at this point. No falls. They have been having more frequent headaches. They were having bad neck pain but this has improved the past 3 months. For the past 2 weeks, they have had diarrhea. They have also noticed that resting heart rate increased from 50s to 70s this past month. They may be moving to [facility] in May but will continue working in [place] several days a week. They provided additional information that they had an upper respiratory  infection for 3 weeks in January.    History on Initial Assessment 03/24/2021: This is a 27 year old right-handed female with a history of kidney stones, epilepsy, presenting to establish care. Records from Dr. Marcello Moores, their epileptologist in [place] from 2017 to 2022, were reviewed, last visit was in 01/2021. At age 27, they started having episodes of staring, they could see and hear but could not understand and was answering gibberish. The first generalized convulsion occurred at age 11, they had woken up feeling unwell, very dizzy and lay on the couch, waking up to EMS around them. They were seen in the ER and started on seizure medication which they took until age 1. They were seizure-free off medication until they had surgery at age 35 and seizures recurred. They recall episodes in 2018 where they would have "brain zaps" and feel "twitchy" but conscious, no loss of time, occurring 50-100 times before they went to bed. This lasted for the summer then stopped. 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 Prior ASMs included Keppra, Tegretol, Depakote, Topamax, Lamictal (rash), which caused mood changes or oversedation. They tolerated Vimpat but stopped it on their own. They estimated around 10 lifetime GTC seizures and states they have not had "real" seizures since age 67. Every time they have anesthesia, they have multiple reportedly convulsive type seizures coming out of anesthesia, last time this occurred was a few hours after hip surgery in April 2022 where they had 7 seizures. Last seizure was in June  2022 with an aura of body getting warm like a wave from feet to head and a swhoosh sound in their head. No loss of awareness but some confusion. Seizures usually occur before bedtime or early morning, triggered by poor sleep. They live with their partner who has described seizures starting with staring/unresponsivenes  followed by a GTC. No focal weakness after, they usually have a headache.   They have been on Vimpat 200mg  BID. In October 2021 they were having significant nausea, dizziness "24/7" with trouble walking, that lasted until April 2022. Consideration for switching Vimpat was done, however they reported symptoms suddenly improved, dizziness is not as bad with a spinning sensation like the room is moving/tilting, now occurring sporadically 1-2 times a day lasting 15-60 minutes. They say they feel it right now during today's visit. No associated headaches, focal numbness/tingling/weakness. No neck/back pain, bladder dysfunction. There is some constipation. Mood is good on mirtazapine 60mg  daily, sleep good with Trazodone 75mg  qhs. They drive. No pregnancy plans, s/p hysterectomy.   Epilepsy Risk Factors:  Brother had seizures secondary to brain tumor. Otherwise they had a normal birth and early development.  There is no history of febrile convulsions, CNS infections such as meningitis/encephalitis, significant traumatic brain injury, neurosurgical procedures.  Diagnostic Data: Prior MRI report unavailable for review, patient noted it may have showed an abnoramlity of swelling in the left frontal lobe but unclear. MRI brain 05/2020 with and without contrast unremarkable MRI brain 10/2015: unremarkable  EEG 10/2015 within normal limits. EEG 04/2018: single burst of irregular slowing with what appears to be intermixed spikes.   Prior ASMs: Keppra, Tegretol, Depakote, Topamax, Lamictal (rash)   PAST MEDICAL HISTORY: Past Medical History:  Diagnosis Date   Anemia    Congenital absence of one kidney    Depression    Pott's disease    Seizures (HCC)     MEDICATIONS: Current Outpatient Medications on File Prior to Visit  Medication Sig Dispense Refill   lacosamide (VIMPAT) 200 MG TABS tablet Take 1 tablet (200 mg total) by mouth 2 (two) times daily. 60 tablet 5   Midazolam (NAYZILAM) 5 MG/0.1ML SOLN  Administer one dose in nostril as needed for seizure. May use second dose in other nostril in another 10 minutes if seizure continues 5 each 5   mirtazapine (REMERON) 30 MG tablet Take 2 tablets (60 mg total) by mouth daily. 180 tablet 1   venlafaxine XR (EFFEXOR-XR) 75 MG 24 hr capsule Take 75 mg by mouth daily with breakfast.     No current facility-administered medications on file prior to visit.    ALLERGIES: Allergies  Allergen Reactions   Amoxicillin     FAMILY HISTORY: Family History  Problem Relation Age of Onset   Hypertension Maternal Grandmother    Diabetes Maternal Grandmother     SOCIAL HISTORY: Social History   Socioeconomic History   Marital status: Single    Spouse name: Not on file   Number of children: Not on file   Years of education: Not on file   Highest education level: Not on file  Occupational History   Not on file  Tobacco Use   Smoking status: Never   Smokeless tobacco: Never  Vaping Use   Vaping Use: Never used  Substance and Sexual Activity   Alcohol use: Never   Drug use: Never   Sexual activity: Not on file  Other Topics Concern   Not on file  Social History Narrative   Right handed  Lives with partner in a two level home   Caffeine none   Social Determinants of Health   Financial Resource Strain: Not on file  Food Insecurity: Not on file  Transportation Needs: Not on file  Physical Activity: Not on file  Stress: Not on file  Social Connections: Not on file  Intimate Partner Violence: Not on file     PHYSICAL EXAM: Vitals:   11/15/22 1426  BP: 120/81  Pulse: 82  SpO2: 97%   General: No acute distress Head:  Normocephalic/atraumatic Skin/Extremities: No rash, no edema Neurological Exam: alert and awake. No aphasia or dysarthria. Fund of knowledge is appropriate. Attention and concentration are normal.   Cranial nerves: Pupils equal, round. Extraocular movements intact with no nystagmus. Visual fields full.  No facial  asymmetry.  Motor: Bulk and tone normal, muscle strength 5/5 throughout except for 4/5 right finger extensors, decreased fine finger movements on right. No orbiting. Reflexes brisk +3 on right with +hoffman sign and upgoing toe on right. +2 on left UE and LE. Finger to nose testing intact.  Gait narrow-based and steady, able to tandem walk adequately.  Romberg negative. Slight postural tremor on right hand.   IMPRESSION: This is a 27 yo RH female with a history of kidney stones with generalized epilepsy. EEG showed single burst of irregular generalized slowing with possible intermixed spikes. They presented 3 weeks ago with new symptoms of sudden onset paresthesias with weakness, worse on the right side. Etiology unclear, stress may play a role. MRI brain, EEG, and EMG/NCV of right arm and leg are normal. The weakness and paresthesias have improved, there is mostly right hand weakness but they also has brisk reflexes with +Hoffman sign and upgoing toe on the right. MRI cervical spine with and without contrast will again be ordered to assess for myelopathy. They had a seizure on 3/20 in the setting of running out of medication, continue Lacosamide 200mg  BID, prn Nayzilam, refills sent.  They are aware of [place] driving laws to stop driving after a seizure until 6 months seizure-free. Follow-up in 4 months, call for any changes.    Thank you for allowing me to participate in her care.  Please do not hesitate to call for any questions or concerns.   Ellouise Newer, M.D.   CC: Dr. Sharlet Salina

## 2022-11-18 DIAGNOSIS — F331 Major depressive disorder, recurrent, moderate: Secondary | ICD-10-CM | POA: Diagnosis not present

## 2022-11-21 DIAGNOSIS — F4312 Post-traumatic stress disorder, chronic: Secondary | ICD-10-CM | POA: Diagnosis not present

## 2022-11-28 DIAGNOSIS — F4312 Post-traumatic stress disorder, chronic: Secondary | ICD-10-CM | POA: Diagnosis not present

## 2022-11-30 ENCOUNTER — Encounter: Payer: Self-pay | Admitting: Neurology

## 2022-12-06 DIAGNOSIS — F4312 Post-traumatic stress disorder, chronic: Secondary | ICD-10-CM | POA: Diagnosis not present

## 2022-12-09 DIAGNOSIS — F439 Reaction to severe stress, unspecified: Secondary | ICD-10-CM | POA: Diagnosis not present

## 2022-12-10 ENCOUNTER — Ambulatory Visit
Admission: RE | Admit: 2022-12-10 | Discharge: 2022-12-10 | Disposition: A | Discharge status: Home / Self Care | Payer: BC Managed Care – PPO | Source: Ambulatory Visit | Attending: Neurology | Admitting: Neurology

## 2022-12-10 DIAGNOSIS — R531 Weakness: Secondary | ICD-10-CM

## 2022-12-10 DIAGNOSIS — R292 Abnormal reflex: Secondary | ICD-10-CM

## 2022-12-10 DIAGNOSIS — M50023 Cervical disc disorder at C6-C7 level with myelopathy: Secondary | ICD-10-CM | POA: Diagnosis not present

## 2022-12-10 MED ORDER — GADOPICLENOL 0.5 MMOL/ML IV SOLN
6.0000 mL | Freq: Once | INTRAVENOUS | Status: AC | PRN
  Administered 2022-12-10: 6 mL via INTRAVENOUS

## 2022-12-13 ENCOUNTER — Other Ambulatory Visit: Payer: Self-pay

## 2022-12-13 DIAGNOSIS — M542 Cervicalgia: Secondary | ICD-10-CM

## 2022-12-14 DIAGNOSIS — F4312 Post-traumatic stress disorder, chronic: Secondary | ICD-10-CM | POA: Diagnosis not present

## 2022-12-15 ENCOUNTER — Ambulatory Visit (INDEPENDENT_AMBULATORY_CARE_PROVIDER_SITE_OTHER): Payer: BC Managed Care – PPO

## 2022-12-15 ENCOUNTER — Ambulatory Visit
Admission: RE | Admit: 2022-12-15 | Discharge: 2022-12-15 | Disposition: A | Discharge status: Home / Self Care | Payer: BC Managed Care – PPO | Source: Ambulatory Visit | Attending: Emergency Medicine | Admitting: Emergency Medicine

## 2022-12-15 VITALS — BP 117/80 | HR 81 | Temp 97.7°F | Resp 18

## 2022-12-15 DIAGNOSIS — M79632 Pain in left forearm: Secondary | ICD-10-CM | POA: Diagnosis not present

## 2022-12-15 DIAGNOSIS — M25539 Pain in unspecified wrist: Secondary | ICD-10-CM | POA: Insufficient documentation

## 2022-12-15 NOTE — ED Triage Notes (Addendum)
Patient to Urgent Care with complaints of left sided arm pain following an injury. Reports on Saturday their large dog pulled on the leash and their arm went in a weird direction. Random areas of bruising present to forearm, points to posterior forearm as area of most pain. Reports pain increases with movement/ lifting.

## 2022-12-15 NOTE — Discharge Instructions (Signed)
The xray is normal.    Take Tylenol or ibuprofen as needed.  Rest and elevate your arm.  Apply ice packs 2-3 times a day for up to 15 minutes each.    Follow up with an orthopedist if your symptoms are not improving.

## 2022-12-15 NOTE — ED Provider Notes (Signed)
Regina Kerr    CSN: [id number] Arrival date & time: 12/15/22  1622      History   Chief Complaint Chief Complaint  Patient presents with   Arm Injury    Entered by patient    HPI Regina Kerr is a 27 y.o. adult.  Patient presents with left forearm pain x 5 days.  Patient was walking the dog when it pulled on it's leash and yanked the left arm.   The pain is worse with movement; improves with rest.  There are small bruises on the patient's forearm.  Treatment attempted with Tylenol.  No numbness, weakness, swelling, redness, or other symptoms.    The history is provided by the patient and medical records.    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      Home Medications    Prior to Admission medications   Medication Sig Start Date End Date Taking? Authorizing Provider  lacosamide (VIMPAT) 200 MG TABS tablet Take 1 tablet (200 mg total) by mouth 2 (two) times daily. 11/15/22   Van Clines, MD  Midazolam (NAYZILAM) 5 MG/0.1ML SOLN Administer one dose in  nostril as needed for seizure. May use second dose in other nostril in another 10 minutes if seizure continues 11/15/22   Van Clines, MD  mirtazapine (REMERON) 30 MG tablet Take 2 tablets (60 mg total) by mouth daily. 11/08/21   Myrlene Broker, MD  venlafaxine XR (EFFEXOR-XR) 75 MG 24 hr capsule Take 75 mg by mouth daily with breakfast.    [provider]    Family History Family History  Problem Relation Age of Onset   Hypertension Maternal Grandmother    Diabetes Maternal Grandmother     Social History Social History   Tobacco Use   Smoking status: Never   Smokeless tobacco: Never  Vaping Use   Vaping Use: Never used  Substance Use Topics   Alcohol use: Never   Drug use: Never     Allergies   Amoxicillin   Review of Systems Review of Systems  Constitutional:  Negative for chills and fever.  Musculoskeletal:  Negative for joint swelling.       Left forearm pain.  Skin:  Negative for color change, rash and wound.  Neurological:  Negative for weakness and numbness.  All other systems reviewed and are negative.    Physical Exam Triage Vital Signs ED Triage Vitals  Enc Vitals Group     BP 12/15/22 1633 117/80     Pulse Rate 12/15/22 1629 81     Resp 12/15/22 1629 18     Temp 12/15/22 1629 97.7 F (36.5 C)     Temp src --      SpO2 12/15/22 1629 98 %     Weight --      Height --      Head Circumference --      Peak Flow --      Pain Score 12/15/22 1631 2     Pain Loc --      Pain Edu? --      Excl. in GC? --    No data found.  Updated Vital Signs BP 117/80   Pulse 81   Temp 97.7 F (36.5 C)   Resp 18   SpO2 98%   Visual Acuity Right Eye Distance:   Left Eye Distance:   Bilateral Distance:    Right Eye Near:   Left Eye Near:    Bilateral Near:     Physical Exam Vitals and nursing note reviewed.  Constitutional:      General: Regina Kerr "Regina Kerr" is not in acute distress.    Appearance: Normal appearance. Regina  Shae Kerr "Regina Kerr" is well-developed. Regina Kerr "Regina Kerr" is not ill-appearing.  HENT:     Mouth/Throat:     Mouth: Mucous membranes are moist.  Cardiovascular:     Rate and Rhythm: Normal rate and regular rhythm.     Heart sounds: Normal heart sounds.  Pulmonary:     Effort: Pulmonary effort is normal. No respiratory distress.     Breath sounds: Normal breath sounds.  Musculoskeletal:        General: Tenderness present. No swelling, deformity or signs of injury. Normal range of motion.       Arms:     Cervical back: Neck supple.  Skin:    General: Skin is warm and dry.     Capillary Refill: Capillary refill takes less than 2 seconds.     Findings: Bruising present. No erythema, lesion or rash.     Comments: Three small 1-2 cm circular bruises on left forearm.   Neurological:     General: No focal deficit present.     Mental Status: Regina Kerr "Regina Kerr" is alert and oriented to person, place, and time.     Sensory: No sensory deficit.     Motor: No weakness.  Psychiatric:        Mood and Affect: Mood normal.        Behavior: Behavior normal.      UC Treatments / Results  Labs (all labs ordered are listed, but only abnormal results are displayed) Labs Reviewed - No data to display  EKG   Radiology DG Forearm Left  Result Date: 12/15/2022 CLINICAL DATA:  Pain following injury. EXAM: LEFT FOREARM - 2 VIEW COMPARISON:  None Available. FINDINGS: There is no evidence of fracture or other focal bone lesions. Soft tissues are unremarkable. IMPRESSION: Negative. Electronically Signed   By: Larose Hires D.O.   On: 12/15/2022 16:51    Procedures Procedures (including critical care time)  Medications Ordered in UC Medications - No data to display  Initial Impression / Assessment and Plan / UC Course  I have reviewed the triage vital signs and the nursing notes.  Pertinent labs & imaging results that were available during my care of the patient were reviewed by  me and considered in my medical decision making (see chart for details).    Left forearm pain.  X-ray negative.  Treating with rest, elevation, ice packs, ibuprofen.  Instructed patient to follow-up with orthopedics if symptoms are not improving.  Contact information for on-call Ortho provided.  Patient agrees to plan of care.   Final Clinical Impressions(s) / UC Diagnoses   Final diagnoses:  Left forearm pain     Discharge Instructions      The xray is normal.    Take Tylenol or ibuprofen as needed.  Rest and elevate your arm.  Apply ice packs 2-3 times a day for up to 15 minutes each.    Follow up with an orthopedist if your symptoms are not improving.        ED Prescriptions   None    PDMP not reviewed this encounter.   Mickie Bail, NP 12/15/22 [id number]

## 2022-12-16 DIAGNOSIS — F439 Reaction to severe stress, unspecified: Secondary | ICD-10-CM | POA: Diagnosis not present

## 2022-12-26 DIAGNOSIS — M542 Cervicalgia: Secondary | ICD-10-CM | POA: Diagnosis not present

## 2022-12-26 DIAGNOSIS — F4312 Post-traumatic stress disorder, chronic: Secondary | ICD-10-CM | POA: Diagnosis not present

## 2022-12-26 DIAGNOSIS — F331 Major depressive disorder, recurrent, moderate: Secondary | ICD-10-CM | POA: Diagnosis not present

## 2023-01-02 DIAGNOSIS — F4312 Post-traumatic stress disorder, chronic: Secondary | ICD-10-CM | POA: Diagnosis not present

## 2023-01-03 ENCOUNTER — Ambulatory Visit
Admission: EM | Admit: 2023-01-03 | Discharge: 2023-01-03 | Disposition: A | Discharge status: Home / Self Care | Payer: BC Managed Care – PPO

## 2023-01-03 DIAGNOSIS — M7918 Myalgia, other site: Secondary | ICD-10-CM | POA: Diagnosis not present

## 2023-01-03 NOTE — ED Triage Notes (Addendum)
Patient to Urgent Care with complaints of right sided knee pain and sternum pain following a fall last night.   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 Denies any SHOB. Reports soreness with deep breaths/ movement.

## 2023-01-03 NOTE — ED Provider Notes (Signed)
Regina Kerr    CSN: [id number] Arrival date & time: 01/03/23  1858      History   Chief Complaint Chief Complaint  Patient presents with   Fall    HPI [facility] Regina Kerr is a 27 y.o. adult.  Patient presents with right knee pain and chest wall pain since having a fall while ice skating last night.  The pain is worse with palpation and movement.  Treatment at home with Tylenol.  No shortness of breath, cough, abdominal pain, dizziness, weakness, numbness, or other symptoms.  The history is provided by the patient and medical records.    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      Home Medications    Prior to Admission medications   Medication Sig Start Date End Date Taking? Authorizing Provider  lacosamide (VIMPAT) 200 MG TABS tablet Take 1 tablet (200 mg total) by mouth 2 (two) times daily. 11/15/22   Van Clines, MD  Midazolam (NAYZILAM) 5 MG/0.1ML SOLN Administer one dose in nostril as needed for seizure. May use second dose in other  nostril in another 10 minutes if seizure continues 11/15/22   Van Clines, MD  mirtazapine (REMERON) 30 MG tablet Take 2 tablets (60 mg total) by mouth daily. 11/08/21   Myrlene Broker, MD  venlafaxine XR (EFFEXOR-XR) 75 MG 24 hr capsule Take 75 mg by mouth daily with breakfast.    [provider]    Family History Family History  Problem Relation Age of Onset   Hypertension Maternal Grandmother    Diabetes Maternal Grandmother     Social History Social History   Tobacco Use   Smoking status: Never   Smokeless tobacco: Never  Vaping Use   Vaping Use: Never used  Substance Use Topics   Alcohol use: Never   Drug use: Never     Allergies   Amoxicillin   Review of Systems Review of Systems  Constitutional:  Negative for chills and fever.  Respiratory:  Negative for cough and shortness of breath.   Cardiovascular:  Negative for chest pain and palpitations.  Gastrointestinal:  Negative for abdominal pain, nausea and vomiting.  Musculoskeletal:  Positive for arthralgias and myalgias. Negative for back pain, gait problem and joint swelling.  Skin:  Negative for color change, rash and wound.  Neurological:  Negative for dizziness, seizures, syncope, weakness, numbness and headaches.  All other  systems reviewed and are negative.    Physical Exam Triage Vital Signs ED Triage Vitals  Enc Vitals Group     BP 01/03/23 1947 121/75     Pulse --      Resp --      Temp --      Temp src --      SpO2 --      Weight --      Height --      Head Circumference --      Peak Flow --      Pain Score 01/03/23 1944 5     Pain Loc --      Pain Edu? --      Excl. in GC? --    No data found.  Updated Vital Signs BP 121/75   Pulse 70   Temp 97.7 F (36.5 C)   Resp 18   SpO2 99%   Visual Acuity Right Eye Distance:   Left Eye Distance:   Bilateral Distance:    Right Eye Near:   Left Eye Near:    Bilateral Near:     Physical Exam Vitals and nursing note  reviewed.  Constitutional:      General: Regina Glenn Zent "Fabian November" is not in acute distress.    Appearance: Normal appearance. Regina Shae Crapps "Fabian November" is well-developed. Regina Shae Milazzo "Fabian November" is not ill-appearing.  HENT:     Head: Normocephalic and atraumatic.     Right Ear: Tympanic membrane normal.     Left Ear: Tympanic membrane normal.     Nose: Nose normal.     Mouth/Throat:     Mouth: Mucous membranes are moist.     Pharynx: Oropharynx is clear.  Cardiovascular:     Rate and Rhythm: Normal rate and regular rhythm.     Heart sounds: Normal heart sounds.  Pulmonary:     Effort: Pulmonary effort is normal. No respiratory distress.     Breath sounds: Normal breath sounds.  Abdominal:     General: Bowel sounds are normal.     Palpations: Abdomen is soft.     Tenderness: There is no abdominal tenderness. There is no guarding or rebound.  Musculoskeletal:        General: Tenderness present. No swelling or deformity. Normal range of motion.     Cervical back: Neck supple.     Comments: Generalized tenderness of anterior chest wall and rib cage.  No focal tenderness.   Skin:    General: Skin is warm and dry.     Capillary Refill: Capillary refill takes less than 2 seconds.     Findings: No bruising, erythema, lesion or rash.  Neurological:     General: No focal deficit present.     Mental Status: Regina Harm "Fabian November" is alert and oriented to person, place, and time.     Sensory: No sensory deficit.     Motor: No weakness.     Gait: Gait normal.  Psychiatric:        Mood and Affect: Mood normal.        Behavior: Behavior normal.      UC Treatments / Results  Labs (all labs ordered are listed, but only abnormal results are displayed) Labs Reviewed - No data to display  EKG   Radiology No results found.  Procedures Procedures (including critical care time)  Medications Ordered in UC Medications - No data to display  Initial Impression / Assessment  and Plan / UC Course  I have reviewed the triage vital signs and the nursing notes.  Pertinent labs & imaging results that were available during my care of the patient were reviewed by me and considered in my medical decision making (see chart for details).    Musculoskeletal pain.  Afebrile and vital signs are stable.  No respiratory distress.  Pain is reproducible with palpation of chest.  Patient declines transfer to the ED.  Discussed symptomatic treatment including Tylenol or ibuprofen as needed.  ED precautions discussed.  Education provided on musculoskeletal pain.  Instructed patient to follow-up with PCP tomorrow.  Patient agrees to plan of care.  Final Clinical Impressions(s) / UC Diagnoses   Final diagnoses:  Musculoskeletal pain     Discharge Instructions      Go to the emergency department if your pain persists or worsens.    Follow-up with your primary care provider tomorrow.     ED Prescriptions   None    PDMP not reviewed this encounter.   Mickie Bail, NP 01/03/23 2008

## 2023-01-03 NOTE — Discharge Instructions (Addendum)
Go to the emergency department if your pain persists or worsens.    Follow-up with your primary care provider tomorrow.

## 2023-01-06 ENCOUNTER — Ambulatory Visit (INDEPENDENT_AMBULATORY_CARE_PROVIDER_SITE_OTHER): Payer: BC Managed Care – PPO

## 2023-01-06 ENCOUNTER — Ambulatory Visit (INDEPENDENT_AMBULATORY_CARE_PROVIDER_SITE_OTHER): Payer: BC Managed Care – PPO | Admitting: Family Medicine

## 2023-01-06 ENCOUNTER — Encounter: Payer: Self-pay | Admitting: Family Medicine

## 2023-01-06 VITALS — BP 112/70 | HR 78 | Temp 97.8°F | Ht 70.0 in | Wt 146.0 lb

## 2023-01-06 DIAGNOSIS — S299XXA Unspecified injury of thorax, initial encounter: Secondary | ICD-10-CM | POA: Diagnosis not present

## 2023-01-06 DIAGNOSIS — R079 Chest pain, unspecified: Secondary | ICD-10-CM | POA: Diagnosis not present

## 2023-01-06 DIAGNOSIS — R0789 Other chest pain: Secondary | ICD-10-CM

## 2023-01-06 MED ORDER — LIDOCAINE 5 % EX PTCH
1.0000 | MEDICATED_PATCH | CUTANEOUS | 0 refills | Status: DC
Start: 2023-01-06 — End: 2023-04-25

## 2023-01-06 NOTE — Progress Notes (Signed)
Subjective:     Patient ID: Regina Kerr, adult    DOB: [birth year], 27 y.o.   MRN: [id number]  Chief Complaint  Patient presents with   Chest Pain    Larey Seat on sternum on Monday and now having constant chest pain, extra painful when taking deep breaths or move    HPI Patient is in today for fall on ice hitting their chest wall 5 days ago. They were ice skating.  They went to [facility] and was advised that they may need to go to the ED for further evaluation. They did not go.  C/o pain to chest wall with movement and with deep inspiration.  Denies fever, chills, dizziness,palpitations, shortness of breath, abdominal pain, N/V.     Health Maintenance Due  Topic Date Due   COVID-19 Vaccine (1) Never done   HIV Screening  Never done   Hepatitis C Screening  Never done   DTaP/Tdap/Td (1 - Tdap) Never done   PAP-Cervical Cytology Screening  Never done   PAP SMEAR-Modifier  Never done    Past Medical History:  Diagnosis Date   Anemia    Congenital absence of one kidney    Depression    Pott's disease    Seizures (HCC)     Past Surgical History:  Procedure Laterality Date   ABDOMINAL EXPLORATION SURGERY     ABDOMINAL HYSTERECTOMY  2017   ANKLE SURGERY     x 3   HIP ARTHROSCOPY W/ LABRAL REPAIR  2022   HIP SURGERY  12/09/2020    Family History  Problem Relation Age of Onset   Hypertension Maternal Grandmother    Diabetes Maternal Grandmother     Social History   Socioeconomic History   Marital status: Single    Spouse name: Not on file   Number of children: Not on file   Years of education: Not on file   Highest education level: Not on file  Occupational History   Not on file  Tobacco Use   Smoking status: Never   Smokeless tobacco: Never  Vaping Use   Vaping Use: Never used  Substance and Sexual Activity   Alcohol use: Never   Drug use: Never   Sexual activity: Not on file  Other Topics Concern   Not on file  Social History Narrative   Right handed     Lives with partner in a two level home   Caffeine none   Social Determinants of Health   Financial Resource Strain: Not on file  Food Insecurity: Not on file  Transportation Needs: Not on file  Physical Activity: Not on file  Stress: Not on file  Social Connections: Not on file  Intimate Partner Violence: Not on file    Outpatient Medications Prior to Visit  Medication Sig Dispense Refill   lacosamide (VIMPAT) 200 MG TABS tablet Take 1 tablet (200 mg total) by mouth 2 (two) times daily. 180 tablet 3   Midazolam (NAYZILAM) 5 MG/0.1ML SOLN Administer one dose in nostril as needed for seizure. May use second dose in other nostril in another 10 minutes if seizure continues 5 each 5   mirtazapine (REMERON) 30 MG tablet Take 2 tablets (60 mg total) by mouth daily. 180 tablet 1   venlafaxine XR (EFFEXOR-XR) 75 MG 24 hr capsule Take 75 mg by mouth daily with breakfast.     No facility-administered medications prior to visit.    Allergies  Allergen Reactions   Amoxicillin     ROS  Objective:    Physical Exam Constitutional:      General: Jenissa Shae Franze "Fabian November" is not in acute distress.    Appearance: Maxine Glenn Bernardy "Fabian November" is not ill-appearing.  Eyes:     Extraocular Movements: Extraocular movements intact.     Conjunctiva/sclera: Conjunctivae normal.  Cardiovascular:     Rate and Rhythm: Normal rate and regular rhythm.  Pulmonary:     Effort: Pulmonary effort is normal.     Breath sounds: Normal breath sounds.  Chest:     Chest wall: Tenderness present. No swelling or crepitus.  Breasts:    Breasts are symmetrical.     Comments: TTP to sternum. Pain reproduced with movement  Abdominal:     General: There is no abdominal bruit.  Musculoskeletal:        General: Normal range of motion.     Cervical back: Normal range of motion and neck supple. No muscular tenderness. Normal range of motion.  Lymphadenopathy:     Upper Body:     Right upper body: No  supraclavicular adenopathy.     Left upper body: No supraclavicular adenopathy.  Skin:    General: Skin is warm and dry.  Neurological:     General: No focal deficit present.     Mental Status: Regina Kerr "Fabian November" is alert and oriented to person, place, and time.  Psychiatric:        Mood and Affect: Mood normal.        Behavior: Behavior normal.        Thought Content: Thought content normal.     BP 112/70 (BP Location: Left Arm, Patient Position: Sitting, Cuff Size: Normal)   Pulse 78   Temp 97.8 F (36.6 C) (Temporal)   Ht 5\' 10"  (1.778 m)   Wt 146 lb (66.2 kg)   SpO2 96%   BMI 20.95 kg/m  Wt Readings from Last 3 Encounters:  01/06/23 146 lb (66.2 kg)  11/15/22 142 lb 3.2 oz (64.5 kg)  10/20/22 140 lb 12.8 oz (63.9 kg)       Assessment & Plan:   Problem List Items Addressed This Visit   None Visit Diagnoses     Injury of chest wall, initial encounter    -  Primary   Relevant Medications   lidocaine (LIDODERM) 5 %   Other Relevant Orders   EKG 12-Lead   DG Chest 2 View (Completed)   Chest wall tenderness       Relevant Medications   lidocaine (LIDODERM) 5 %   Other Relevant Orders   EKG 12-Lead   DG Chest 2 View (Completed)      EKG shows NSR with rate 71, no acute changes.  STAT chest XR ordered.  Recommend pain management with ice or heat, Tylenol 1,000 mg and ibuprofen 600 mg with food. Lidocaine patch prescribed.  Follow up prn   I am having Juleen S. Pineo "Shae" start on lidocaine. I am also having Johann S. Scullion "Shae" maintain Bryar S. Goetting "Shae"'s mirtazapine, venlafaxine XR, lacosamide, and Nayzilam.  Meds ordered this encounter  Medications   lidocaine (LIDODERM) 5 %    Sig: Place 1 patch onto the skin daily. Remove & Discard patch within 12 hours or as directed by MD    Dispense:  30 patch    Refill:  0    Order Specific Question:   Supervising Provider    Answer:   Hillard Danker A [[id number]]

## 2023-01-06 NOTE — Patient Instructions (Addendum)
Please go downstairs for a chest X ray before you leave.   Use ice or heat.  You may alternate Tylenol 1,000 mg (take every12 hours) and ibuprofen 600 mg with food (take every 8 hours).   Use the Lidocaine patch if affordable.   If you have any new or worsening symptoms, please go to the emergency department.

## 2023-01-11 DIAGNOSIS — F4312 Post-traumatic stress disorder, chronic: Secondary | ICD-10-CM | POA: Diagnosis not present

## 2023-01-17 DIAGNOSIS — F4312 Post-traumatic stress disorder, chronic: Secondary | ICD-10-CM | POA: Diagnosis not present

## 2023-01-23 DIAGNOSIS — F4312 Post-traumatic stress disorder, chronic: Secondary | ICD-10-CM | POA: Diagnosis not present

## 2023-01-25 NOTE — Progress Notes (Unsigned)
    Aleen Sells D.[facility] [address] [place] [address] Phone: [phone]   Assessment and Plan:     There are no diagnoses linked to this encounter.  ***   Pertinent previous records reviewed include ***   Follow Up: ***     Subjective:   I, Regina Kerr, am serving as a Neurosurgeon for Doctor Richardean Sale   Chief Complaint: right shoulder pain    HPI:  11/15/2021 Patient is a 27 year old female complaining of right shoulder pain. Patient states that they have a 100 join problems moved here from [place] last summer, injured right shoulder  playing volleyball and aerial silks 3-4 years ago has been to PT but the pain never went away. Is playing vb now and cant hit the ball overhand without pain in the front of the shoulder , no numbness tingling , no radiating pain, has tried ib and tylenol and they do nothing to help, takes meloxicam daily has been on long term, decreased ROM    08/23/2022 Patient states that right shoulder flared about 2 weeks , no MOI, same symptoms    01/26/2023 Patient states   Relevant Historical Information: Congenital unilateral kidney  Additional pertinent review of systems negative.   Current Outpatient Medications:    lacosamide (VIMPAT) 200 MG TABS tablet, Take 1 tablet (200 mg total) by mouth 2 (two) times daily., Disp: 180 tablet, Rfl: 3   lidocaine (LIDODERM) 5 %, Place 1 patch onto the skin daily. Remove & Discard patch within 12 hours or as directed by MD, Disp: 30 patch, Rfl: 0   Midazolam (NAYZILAM) 5 MG/0.1ML SOLN, Administer one dose in nostril as needed for seizure. May use second dose in other nostril in another 10 minutes if seizure continues, Disp: 5 each, Rfl: 5   mirtazapine (REMERON) 30 MG tablet, Take 2 tablets (60 mg total) by mouth daily., Disp: 180 tablet, Rfl: 1   venlafaxine XR (EFFEXOR-XR) 75 MG 24 hr capsule, Take 75 mg by mouth daily with breakfast., Disp: , Rfl:     Objective:     There were no vitals filed for this visit.    There is no height or weight on file to calculate BMI.    Physical Exam:    ***   Electronically signed by:  Aleen Sells D.[facility] 7:20 AM 01/25/23

## 2023-01-26 ENCOUNTER — Ambulatory Visit (INDEPENDENT_AMBULATORY_CARE_PROVIDER_SITE_OTHER): Payer: BC Managed Care – PPO | Admitting: Sports Medicine

## 2023-01-26 VITALS — BP 102/80 | HR 81 | Ht 70.0 in | Wt 151.0 lb

## 2023-01-26 DIAGNOSIS — M25511 Pain in right shoulder: Secondary | ICD-10-CM

## 2023-01-26 DIAGNOSIS — M67911 Unspecified disorder of synovium and tendon, right shoulder: Secondary | ICD-10-CM

## 2023-01-26 DIAGNOSIS — G8929 Other chronic pain: Secondary | ICD-10-CM | POA: Diagnosis not present

## 2023-01-26 NOTE — Patient Instructions (Signed)
PT referral  MRI right shoulder  Follow up 4 days after MRI to discuss results

## 2023-01-30 DIAGNOSIS — F4312 Post-traumatic stress disorder, chronic: Secondary | ICD-10-CM | POA: Diagnosis not present

## 2023-01-31 ENCOUNTER — Telehealth: Payer: Self-pay

## 2023-01-31 NOTE — Telephone Encounter (Signed)
Faxed 47 pages of clinicals including Imaging, neurology office visit notes, and HEP to appeals number given below to attention APPEALS

## 2023-01-31 NOTE — Telephone Encounter (Signed)
Received a denial for patients MRI of right shoulder WO contrast. You can call at your convenience [phone] for a peer to peer consultation.   The denial reason is as stated below:  Your doctor is checking you for a tear in the group of muscles that holds the shoulder in place (rotator cuff tear). Your doctor ordered an MRI of your shoulder. An MRI is a way to take pictures of the inside of your body.   This test should be used when your condition has not improved after at least six weeks of treatment by your doctor. Treatment should include medications and other forms of therapy. These need to include home exercises or physical therapy. We reviewed the notes we have. The notes do not show that you had at least six weeks of such treatment. Based on the information we have, this test is not medically necessary.    I have uploaded/sent all clinicals and imaging starting from 11/15/21, not sure what more I can supply them.

## 2023-01-31 NOTE — Telephone Encounter (Signed)
Printed and given to [employer]

## 2023-01-31 NOTE — Telephone Encounter (Signed)
Is there anything else you would like me to attach? I can write a letter with specific exercises that were done like the HEP program if one was given

## 2023-02-01 DIAGNOSIS — F331 Major depressive disorder, recurrent, moderate: Secondary | ICD-10-CM | POA: Diagnosis not present

## 2023-02-15 ENCOUNTER — Ambulatory Visit
Admission: RE | Admit: 2023-02-15 | Discharge: 2023-02-15 | Disposition: A | Discharge status: Home / Self Care | Payer: BC Managed Care – PPO | Source: Ambulatory Visit | Attending: Sports Medicine | Admitting: Sports Medicine

## 2023-02-15 DIAGNOSIS — M25511 Pain in right shoulder: Secondary | ICD-10-CM | POA: Diagnosis not present

## 2023-02-15 DIAGNOSIS — G8929 Other chronic pain: Secondary | ICD-10-CM | POA: Diagnosis not present

## 2023-02-15 DIAGNOSIS — M67911 Unspecified disorder of synovium and tendon, right shoulder: Secondary | ICD-10-CM

## 2023-02-17 DIAGNOSIS — F4312 Post-traumatic stress disorder, chronic: Secondary | ICD-10-CM | POA: Diagnosis not present

## 2023-02-20 ENCOUNTER — Telehealth: Payer: Self-pay | Admitting: Sports Medicine

## 2023-02-20 DIAGNOSIS — F4312 Post-traumatic stress disorder, chronic: Secondary | ICD-10-CM | POA: Diagnosis not present

## 2023-02-20 NOTE — Telephone Encounter (Signed)
Called and left message for pt to call back.

## 2023-03-06 DIAGNOSIS — F4312 Post-traumatic stress disorder, chronic: Secondary | ICD-10-CM | POA: Diagnosis not present

## 2023-03-13 DIAGNOSIS — F4312 Post-traumatic stress disorder, chronic: Secondary | ICD-10-CM | POA: Diagnosis not present

## 2023-03-20 DIAGNOSIS — F4312 Post-traumatic stress disorder, chronic: Secondary | ICD-10-CM | POA: Diagnosis not present

## 2023-03-27 DIAGNOSIS — F4312 Post-traumatic stress disorder, chronic: Secondary | ICD-10-CM | POA: Diagnosis not present

## 2023-03-30 DIAGNOSIS — F4312 Post-traumatic stress disorder, chronic: Secondary | ICD-10-CM | POA: Diagnosis not present

## 2023-04-06 DIAGNOSIS — F4312 Post-traumatic stress disorder, chronic: Secondary | ICD-10-CM | POA: Diagnosis not present

## 2023-04-10 DIAGNOSIS — F4312 Post-traumatic stress disorder, chronic: Secondary | ICD-10-CM | POA: Diagnosis not present

## 2023-04-10 DIAGNOSIS — F331 Major depressive disorder, recurrent, moderate: Secondary | ICD-10-CM | POA: Diagnosis not present

## 2023-04-17 DIAGNOSIS — F509 Eating disorder, unspecified: Secondary | ICD-10-CM | POA: Diagnosis not present

## 2023-04-17 DIAGNOSIS — F429 Obsessive-compulsive disorder, unspecified: Secondary | ICD-10-CM | POA: Diagnosis not present

## 2023-04-17 DIAGNOSIS — F4310 Post-traumatic stress disorder, unspecified: Secondary | ICD-10-CM | POA: Diagnosis not present

## 2023-04-17 DIAGNOSIS — F339 Major depressive disorder, recurrent, unspecified: Secondary | ICD-10-CM | POA: Diagnosis not present

## 2023-04-19 DIAGNOSIS — F4312 Post-traumatic stress disorder, chronic: Secondary | ICD-10-CM | POA: Diagnosis not present

## 2023-04-25 ENCOUNTER — Ambulatory Visit (INDEPENDENT_AMBULATORY_CARE_PROVIDER_SITE_OTHER): Payer: BC Managed Care – PPO | Admitting: Neurology

## 2023-04-25 ENCOUNTER — Encounter: Payer: Self-pay | Admitting: Neurology

## 2023-04-25 VITALS — BP 109/61 | HR 77 | Ht 70.0 in | Wt 162.6 lb

## 2023-04-25 DIAGNOSIS — G40309 Generalized idiopathic epilepsy and epileptic syndromes, not intractable, without status epilepticus: Secondary | ICD-10-CM | POA: Diagnosis not present

## 2023-04-25 DIAGNOSIS — F4312 Post-traumatic stress disorder, chronic: Secondary | ICD-10-CM | POA: Diagnosis not present

## 2023-04-25 MED ORDER — LACOSAMIDE 200 MG PO TABS: 200.0000 mg | ORAL_TABLET | Freq: Two times a day (BID) | ORAL | 3 refills | Status: DC

## 2023-04-25 MED ORDER — NAYZILAM 5 MG/0.1ML NA SOLN: NASAL | 5 refills | Status: AC

## 2023-04-25 NOTE — Progress Notes (Signed)
NEUROLOGY FOLLOW UP OFFICE NOTE  Regina Kerr [id number] 1995-12-09  HISTORY OF PRESENT ILLNESS: I had the pleasure of seeing Regina Kerr in follow-up in the neurology clinic on 04/26/2023.  The patient was last seen 6 months ago. Since their last visit, they report having an aura with a pre-seizure/mini-seizure feelings back to back, feeling that they would go unconscious. They used Nayzilam and symptoms resolved. No clear triggers. They continue on Lacosamide 200mg  BID without side effects. They presented at the end of February with numbness and weakness in both legs, R>L with irregular jerky movements on the right side. They had an extensive evaluation including MRI brain without contrast which was normal. EEG was normal, during the EEG they had left arm pronation-supination movements with no correlate. EMG/NCV of the right arm and leg were normal. Bloodwork was negative for an autoimmune process. MRI cervical spine showed a left paracentral disc protrusion at C5-6 and mild noncompressive disc bulging at C4-5 and C6-7 without stenosis. Symptoms resolved on their own, possibly stress-induced, they have now moved to [facility] and have been doing well. No headaches, dizziness, vision changes, focal numbness/tingling/weakness, myoclonic jerks. They still have some neck pain and had seen Ortho, given home exercises and muscle relaxant which helped.  History on Initial Assessment 03/24/2021: This is a 27 year old right-handed female with a history of kidney stones, epilepsy, presenting to establish care. Records from Dr. Maisie Fus, their epileptologist in [place] from 2017 to 2022, were reviewed, last visit was in 01/2021. At age 27, they started having episodes of staring, they could see and hear but could not understand and was answering gibberish. The first generalized convulsion occurred at age 27, they had woken up feeling unwell, very dizzy and lay on the couch, waking up to EMS around them.  They were seen in the ER and started on seizure medication which they took until age 81. They were seizure-free off medication until they had surgery at age 27 and seizures recurred. They recall episodes in 2018 where they would have "brain zaps" and feel "twitchy" but conscious, no loss of time, occurring 50-100 times before they went to bed. This lasted for the summer then stopped. 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 Prior ASMs included Keppra, Tegretol, Depakote, Topamax, Lamictal (rash), which caused mood changes or oversedation. They tolerated Vimpat but stopped it on their own. They estimated around 10 lifetime GTC seizures and states they have not had "real" seizures since age 27. Every time they have anesthesia, they have multiple reportedly convulsive type seizures coming out of anesthesia, last time this occurred was a few hours after hip surgery in April 2022 where they had 7 seizures. Last seizure was in June 2022 with an aura of body getting warm like a wave from feet to head and a swhoosh sound in their head. No loss of awareness but some confusion. Seizures usually occur before bedtime or early morning, triggered by poor sleep. They live with their partner who has described seizures starting with staring/unresponsivenes followed by a GTC. No focal weakness after, they usually have a headache.   They have been on Vimpat 200mg  BID. In October 2021 they were having significant nausea, dizziness "24/7" with trouble walking, that lasted until April 2022. Consideration for switching Vimpat was done, however they reported symptoms suddenly improved, dizziness is not as bad with a spinning sensation like the room is moving/tilting, now occurring  sporadically 1-2 times a day lasting 15-60 minutes. They say they feel it right now during today's visit. No associated headaches, focal numbness/tingling/weakness.  No neck/back pain, bladder dysfunction. There is some constipation. Mood is good on mirtazapine 60mg  daily, sleep good with Trazodone 75mg  qhs. They drive. No pregnancy plans, s/p hysterectomy.   Epilepsy Risk Factors:  Brother had seizures secondary to brain tumor. Otherwise they had a normal birth and early development.  There is no history of febrile convulsions, CNS infections such as meningitis/encephalitis, significant traumatic brain injury, neurosurgical procedures.  Diagnostic Data: Prior MRI report unavailable for review, patient noted it may have showed an abnoramlity of swelling in the left frontal lobe but unclear. MRI brain 05/2020 with and without contrast unremarkable MRI brain 10/2015: unremarkable  EEG 10/2015 within normal limits. EEG 04/2018: single burst of irregular slowing with what appears to be intermixed spikes.   Prior ASMs: Keppra, Tegretol, Depakote, Topamax, Lamictal (rash)   PAST MEDICAL HISTORY: Past Medical History:  Diagnosis Date   Anemia    Congenital absence of one kidney    Depression    Pott's disease    Seizures (HCC)     MEDICATIONS: Current Outpatient Medications on File Prior to Visit  Medication Sig Dispense Refill   lacosamide (VIMPAT) 200 MG TABS tablet Take 1 tablet (200 mg total) by mouth 2 (two) times daily. 180 tablet 3   Midazolam (NAYZILAM) 5 MG/0.1ML SOLN Administer one dose in nostril as needed for seizure. May use second dose in other nostril in another 10 minutes if seizure continues 5 each 5   mirtazapine (REMERON) 30 MG tablet Take 2 tablets (60 mg total) by mouth daily. 180 tablet 1   venlafaxine XR (EFFEXOR-XR) 75 MG 24 hr capsule Take 75 mg by mouth daily with breakfast.     No current facility-administered medications on file prior to visit.    ALLERGIES: Allergies  Allergen Reactions   Amoxicillin     FAMILY HISTORY: Family History  Problem Relation Age of Onset   Hypertension Maternal Grandmother    Diabetes  Maternal Grandmother     SOCIAL HISTORY: Social History   Socioeconomic History   Marital status: Single    Spouse name: Not on file   Number of children: Not on file   Years of education: Not on file   Highest education level: Not on file  Occupational History   Not on file  Tobacco Use   Smoking status: Never   Smokeless tobacco: Never  Vaping Use   Vaping status: Never Used  Substance and Sexual Activity   Alcohol use: Never   Drug use: Never   Sexual activity: Not on file  Other Topics Concern   Not on file  Social History Narrative   Right handed    Lives with partner in a two level home   Caffeine none   Social Determinants of Health   Financial Resource Strain: Not on file  Food Insecurity: Not on file  Transportation Needs: Not on file  Physical Activity: Not on file  Stress: Not on file  Social Connections: Not on file  Intimate Partner Violence: Not on file     PHYSICAL EXAM: Vitals:   04/25/23 1113  BP: 109/61  Pulse: 77  SpO2: 99%   General: No acute distress Head:  Normocephalic/atraumatic Skin/Extremities: No rash, no edema Neurological Exam: alert and awake. No aphasia or dysarthria. Fund of knowledge is appropriate.   Attention and concentration are normal.   Cranial  nerves: Pupils equal, round. Extraocular movements intact with no nystagmus. Visual fields full.  No facial asymmetry.  Motor: Bulk and tone normal, muscle strength 5/5 throughout with no pronator drift. Reflexes +2 throughout.  Finger to nose testing intact.  Gait narrow-based and steady, able to tandem walk adequately.  Romberg negative.   IMPRESSION: This is a 27 yo RH female with a history of nephrolithiasis, with generalized epilepsy. EEG showed single burst of irregular generalized slowing with possible intermixed spikes. In the past 6 months, they had one recurring aura that resolved after Nayzilam. Continue Lacosamide 200mg  BID. They had sudden onset paresthesias with  weakness, worse on the right side with some irregular jerky movements in February 2024. MRI brain, cervical spine, EEG, and EMG/NCV of right arm/leg normal. Etiology unclear, stress may play a role. Symptoms have self-resolved. They have moved to [facility] and would like to establish care locally. Refills sent for Lacosamide and Nayzilam. They are aware of [place] driving laws to stop driving after a seizure until 6 months seizure-free. Follow-up as needed, call for any changes.   Thank you for allowing me to participate in their care.  Please do not hesitate to call for any questions or concerns.    Patrcia Dolly, M.D.   CC: Dr. Okey Dupre

## 2023-04-25 NOTE — Patient Instructions (Addendum)
Good to see you doing well. Wishing you all the best!  Refills sent for your medications. We will send a referral to the [facility]  [phone]   for transfer of care in [place]. Follow-up as needed, call for any changes   Seizure Precautions: 1. If medication has been prescribed for you to prevent seizures, take it exactly as directed.  Do not stop taking the medicine without talking to your doctor first, even if you have not had a seizure in a long time.   2. Avoid activities in which a seizure would cause danger to yourself or to others.  Don't operate dangerous machinery, swim alone, or climb in high or dangerous places, such as on ladders, roofs, or girders.  Do not drive unless your doctor says you may.  3. If you have any warning that you may have a seizure, lay down in a safe place where you can't hurt yourself.    4.  No driving for 6 months from last seizure, as per [facility].   Please refer to the following link on the Epilepsy [employer] of America's website for more information: http://www.epilepsyfoundation.org/answerplace/Social/driving/drivingu.cfm   5.  Maintain good sleep hygiene. Avoid alcohol.  6.  Contact your doctor if you have any problems that may be related to the medicine you are taking.  7.  Call 911 and bring the patient back to the ED if:        A.  The seizure lasts longer than 5 minutes.       B.  The patient doesn't awaken shortly after the seizure  C.  The patient has new problems such as difficulty seeing, speaking or moving  D.  The patient was injured during the seizure  E.  The patient has a temperature over 102 F (39C)  F.  The patient vomited and now is having trouble breathing

## 2023-04-26 ENCOUNTER — Encounter: Payer: Self-pay | Admitting: Neurology

## 2023-05-01 DIAGNOSIS — F429 Obsessive-compulsive disorder, unspecified: Secondary | ICD-10-CM | POA: Diagnosis not present

## 2023-05-01 DIAGNOSIS — F4310 Post-traumatic stress disorder, unspecified: Secondary | ICD-10-CM | POA: Diagnosis not present

## 2023-05-01 DIAGNOSIS — F424 Excoriation (skin-picking) disorder: Secondary | ICD-10-CM | POA: Diagnosis not present

## 2023-05-01 DIAGNOSIS — F4312 Post-traumatic stress disorder, chronic: Secondary | ICD-10-CM | POA: Diagnosis not present

## 2023-05-01 DIAGNOSIS — F339 Major depressive disorder, recurrent, unspecified: Secondary | ICD-10-CM | POA: Diagnosis not present

## 2023-05-05 DIAGNOSIS — M25552 Pain in left hip: Secondary | ICD-10-CM | POA: Diagnosis not present

## 2023-05-05 DIAGNOSIS — M25551 Pain in right hip: Secondary | ICD-10-CM | POA: Diagnosis not present

## 2023-05-08 DIAGNOSIS — F4312 Post-traumatic stress disorder, chronic: Secondary | ICD-10-CM | POA: Diagnosis not present

## 2023-05-15 DIAGNOSIS — F4310 Post-traumatic stress disorder, unspecified: Secondary | ICD-10-CM | POA: Diagnosis not present

## 2023-05-15 DIAGNOSIS — F424 Excoriation (skin-picking) disorder: Secondary | ICD-10-CM | POA: Diagnosis not present

## 2023-05-15 DIAGNOSIS — F339 Major depressive disorder, recurrent, unspecified: Secondary | ICD-10-CM | POA: Diagnosis not present

## 2023-05-15 DIAGNOSIS — F429 Obsessive-compulsive disorder, unspecified: Secondary | ICD-10-CM | POA: Diagnosis not present

## 2023-05-16 DIAGNOSIS — F4312 Post-traumatic stress disorder, chronic: Secondary | ICD-10-CM | POA: Diagnosis not present

## 2023-05-23 ENCOUNTER — Other Ambulatory Visit: Payer: Self-pay | Admitting: Neurology

## 2023-05-23 DIAGNOSIS — F4312 Post-traumatic stress disorder, chronic: Secondary | ICD-10-CM | POA: Diagnosis not present

## 2023-05-30 DIAGNOSIS — F4312 Post-traumatic stress disorder, chronic: Secondary | ICD-10-CM | POA: Diagnosis not present

## 2023-05-31 NOTE — Progress Notes (Unsigned)
    Aleen Sells D.[facility] [address] [place] [address] Phone: [phone]   Assessment and Plan:     There are no diagnoses linked to this encounter.  ***   Pertinent previous records reviewed include ***   Follow Up: ***     Subjective:   I, Regina Kerr, am serving as a Neurosurgeon for Doctor Richardean Sale   Chief Complaint: right shoulder pain    HPI:  11/15/2021 Patient is a 27 year old female complaining of right shoulder pain. Patient states that they have a 100 join problems moved here from [place] last summer, injured right shoulder  playing volleyball and aerial silks 3-4 years ago has been to PT but the pain never went away. Is playing vb now and cant hit the ball overhand without pain in the front of the shoulder , no numbness tingling , no radiating pain, has tried ib and tylenol and they do nothing to help, takes meloxicam daily has been on long term, decreased ROM    08/23/2022 Patient states that right shoulder flared about 2 weeks , no MOI, same symptoms    01/26/2023 Patient states that pain is back and would like a CSI  06/01/2023 Patient states   Relevant Historical Information: Congenital unilateral kidney  Additional pertinent review of systems negative.   Current Outpatient Medications:    lacosamide (VIMPAT) 200 MG TABS tablet, TAKE 1 TABLET BY MOUTH TWICE A DAY, Disp: 60 tablet, Rfl: 5   Midazolam (NAYZILAM) 5 MG/0.1ML SOLN, Administer one dose in nostril as needed for seizure. May use second dose in other nostril in another 10 minutes if seizure continues, Disp: 5 each, Rfl: 5   mirtazapine (REMERON) 30 MG tablet, Take 2 tablets (60 mg total) by mouth daily., Disp: 180 tablet, Rfl: 1   venlafaxine XR (EFFEXOR-XR) 75 MG 24 hr capsule, Take 75 mg by mouth daily with breakfast., Disp: , Rfl:    Objective:     There were no vitals filed for this visit.    There is no height or weight on file to  calculate BMI.    Physical Exam:    ***   Electronically signed by:  Aleen Sells D.[facility] 3:23 PM 05/31/23

## 2023-06-01 ENCOUNTER — Ambulatory Visit: Payer: BC Managed Care – PPO | Admitting: Sports Medicine

## 2023-06-01 VITALS — BP 120/80 | Ht 70.0 in | Wt 163.0 lb

## 2023-06-01 DIAGNOSIS — G8929 Other chronic pain: Secondary | ICD-10-CM | POA: Diagnosis not present

## 2023-06-01 DIAGNOSIS — M67911 Unspecified disorder of synovium and tendon, right shoulder: Secondary | ICD-10-CM | POA: Diagnosis not present

## 2023-06-01 DIAGNOSIS — M25511 Pain in right shoulder: Secondary | ICD-10-CM | POA: Diagnosis not present

## 2023-06-07 DIAGNOSIS — F429 Obsessive-compulsive disorder, unspecified: Secondary | ICD-10-CM | POA: Diagnosis not present

## 2023-06-07 DIAGNOSIS — F339 Major depressive disorder, recurrent, unspecified: Secondary | ICD-10-CM | POA: Diagnosis not present

## 2023-06-07 DIAGNOSIS — F424 Excoriation (skin-picking) disorder: Secondary | ICD-10-CM | POA: Diagnosis not present

## 2023-06-07 DIAGNOSIS — F4310 Post-traumatic stress disorder, unspecified: Secondary | ICD-10-CM | POA: Diagnosis not present

## 2023-06-14 DIAGNOSIS — F4312 Post-traumatic stress disorder, chronic: Secondary | ICD-10-CM | POA: Diagnosis not present

## 2023-06-15 DIAGNOSIS — Z90710 Acquired absence of both cervix and uterus: Secondary | ICD-10-CM | POA: Diagnosis not present

## 2023-06-15 DIAGNOSIS — M26609 Unspecified temporomandibular joint disorder, unspecified side: Secondary | ICD-10-CM | POA: Diagnosis not present

## 2023-06-15 DIAGNOSIS — Z0000 Encounter for general adult medical examination without abnormal findings: Secondary | ICD-10-CM | POA: Diagnosis not present

## 2023-06-15 DIAGNOSIS — Q600 Renal agenesis, unilateral: Secondary | ICD-10-CM | POA: Diagnosis not present

## 2023-06-15 DIAGNOSIS — M25551 Pain in right hip: Secondary | ICD-10-CM | POA: Diagnosis not present

## 2023-06-15 DIAGNOSIS — F32A Depression, unspecified: Secondary | ICD-10-CM | POA: Diagnosis not present

## 2023-06-15 DIAGNOSIS — Z23 Encounter for immunization: Secondary | ICD-10-CM | POA: Diagnosis not present

## 2023-06-15 DIAGNOSIS — M41125 Adolescent idiopathic scoliosis, thoracolumbar region: Secondary | ICD-10-CM | POA: Diagnosis not present

## 2023-06-15 DIAGNOSIS — G40309 Generalized idiopathic epilepsy and epileptic syndromes, not intractable, without status epilepticus: Secondary | ICD-10-CM | POA: Diagnosis not present

## 2023-06-15 DIAGNOSIS — G90A Postural orthostatic tachycardia syndrome (POTS): Secondary | ICD-10-CM | POA: Diagnosis not present

## 2023-06-15 DIAGNOSIS — Z8481 Family history of carrier of genetic disease: Secondary | ICD-10-CM | POA: Diagnosis not present

## 2023-06-15 DIAGNOSIS — M5020 Other cervical disc displacement, unspecified cervical region: Secondary | ICD-10-CM | POA: Diagnosis not present

## 2023-06-15 DIAGNOSIS — G8929 Other chronic pain: Secondary | ICD-10-CM | POA: Diagnosis not present

## 2023-06-15 DIAGNOSIS — K5900 Constipation, unspecified: Secondary | ICD-10-CM | POA: Diagnosis not present

## 2023-06-22 DIAGNOSIS — F4312 Post-traumatic stress disorder, chronic: Secondary | ICD-10-CM | POA: Diagnosis not present

## 2023-06-26 DIAGNOSIS — F4312 Post-traumatic stress disorder, chronic: Secondary | ICD-10-CM | POA: Diagnosis not present

## 2023-06-28 DIAGNOSIS — F4310 Post-traumatic stress disorder, unspecified: Secondary | ICD-10-CM | POA: Diagnosis not present

## 2023-06-28 DIAGNOSIS — F424 Excoriation (skin-picking) disorder: Secondary | ICD-10-CM | POA: Diagnosis not present

## 2023-06-28 DIAGNOSIS — F339 Major depressive disorder, recurrent, unspecified: Secondary | ICD-10-CM | POA: Diagnosis not present

## 2023-06-28 DIAGNOSIS — F429 Obsessive-compulsive disorder, unspecified: Secondary | ICD-10-CM | POA: Diagnosis not present

## 2023-07-10 DIAGNOSIS — M25559 Pain in unspecified hip: Secondary | ICD-10-CM | POA: Diagnosis not present

## 2023-07-10 DIAGNOSIS — F4312 Post-traumatic stress disorder, chronic: Secondary | ICD-10-CM | POA: Diagnosis not present

## 2023-07-10 DIAGNOSIS — M25651 Stiffness of right hip, not elsewhere classified: Secondary | ICD-10-CM | POA: Diagnosis not present

## 2023-07-10 DIAGNOSIS — M542 Cervicalgia: Secondary | ICD-10-CM | POA: Diagnosis not present

## 2023-07-10 DIAGNOSIS — M25511 Pain in right shoulder: Secondary | ICD-10-CM | POA: Diagnosis not present

## 2023-07-12 DIAGNOSIS — F439 Reaction to severe stress, unspecified: Secondary | ICD-10-CM | POA: Diagnosis not present

## 2023-07-17 DIAGNOSIS — F4312 Post-traumatic stress disorder, chronic: Secondary | ICD-10-CM | POA: Diagnosis not present

## 2023-07-17 DIAGNOSIS — H52223 Regular astigmatism, bilateral: Secondary | ICD-10-CM | POA: Diagnosis not present

## 2023-07-17 DIAGNOSIS — H5213 Myopia, bilateral: Secondary | ICD-10-CM | POA: Diagnosis not present

## 2023-07-19 DIAGNOSIS — M26609 Unspecified temporomandibular joint disorder, unspecified side: Secondary | ICD-10-CM | POA: Diagnosis not present

## 2023-07-19 DIAGNOSIS — M25651 Stiffness of right hip, not elsewhere classified: Secondary | ICD-10-CM | POA: Diagnosis not present

## 2023-07-19 DIAGNOSIS — Z0000 Encounter for general adult medical examination without abnormal findings: Secondary | ICD-10-CM | POA: Diagnosis not present

## 2023-07-19 DIAGNOSIS — M25511 Pain in right shoulder: Secondary | ICD-10-CM | POA: Diagnosis not present

## 2023-07-19 DIAGNOSIS — H5789 Other specified disorders of eye and adnexa: Secondary | ICD-10-CM | POA: Diagnosis not present

## 2023-07-19 DIAGNOSIS — G40309 Generalized idiopathic epilepsy and epileptic syndromes, not intractable, without status epilepticus: Secondary | ICD-10-CM | POA: Diagnosis not present

## 2023-07-19 DIAGNOSIS — M25559 Pain in unspecified hip: Secondary | ICD-10-CM | POA: Diagnosis not present

## 2023-07-19 DIAGNOSIS — Q600 Renal agenesis, unilateral: Secondary | ICD-10-CM | POA: Diagnosis not present

## 2023-07-19 DIAGNOSIS — R0602 Shortness of breath: Secondary | ICD-10-CM | POA: Diagnosis not present

## 2023-07-19 DIAGNOSIS — K5900 Constipation, unspecified: Secondary | ICD-10-CM | POA: Diagnosis not present

## 2023-07-19 DIAGNOSIS — N83201 Unspecified ovarian cyst, right side: Secondary | ICD-10-CM | POA: Diagnosis not present

## 2023-07-19 DIAGNOSIS — M542 Cervicalgia: Secondary | ICD-10-CM | POA: Diagnosis not present

## 2023-07-21 ENCOUNTER — Other Ambulatory Visit: Payer: Self-pay | Admitting: Medical Genetics

## 2023-07-24 DIAGNOSIS — Z8674 Personal history of sudden cardiac arrest: Secondary | ICD-10-CM | POA: Diagnosis not present

## 2023-07-24 DIAGNOSIS — R7989 Other specified abnormal findings of blood chemistry: Secondary | ICD-10-CM | POA: Diagnosis not present

## 2023-07-24 DIAGNOSIS — G40909 Epilepsy, unspecified, not intractable, without status epilepticus: Secondary | ICD-10-CM | POA: Diagnosis not present

## 2023-07-24 DIAGNOSIS — F4312 Post-traumatic stress disorder, chronic: Secondary | ICD-10-CM | POA: Diagnosis not present

## 2023-07-24 DIAGNOSIS — R0602 Shortness of breath: Secondary | ICD-10-CM | POA: Diagnosis not present

## 2023-07-24 DIAGNOSIS — G90A Postural orthostatic tachycardia syndrome (POTS): Secondary | ICD-10-CM | POA: Diagnosis not present

## 2023-07-24 DIAGNOSIS — R0789 Other chest pain: Secondary | ICD-10-CM | POA: Diagnosis not present

## 2023-07-24 DIAGNOSIS — R079 Chest pain, unspecified: Secondary | ICD-10-CM | POA: Diagnosis not present

## 2023-07-24 DIAGNOSIS — R791 Abnormal coagulation profile: Secondary | ICD-10-CM | POA: Diagnosis not present

## 2023-07-24 DIAGNOSIS — Z79899 Other long term (current) drug therapy: Secondary | ICD-10-CM | POA: Diagnosis not present

## 2023-07-26 ENCOUNTER — Other Ambulatory Visit: Payer: Self-pay | Admitting: Medical Genetics

## 2023-07-26 DIAGNOSIS — F339 Major depressive disorder, recurrent, unspecified: Secondary | ICD-10-CM | POA: Diagnosis not present

## 2023-07-26 DIAGNOSIS — F424 Excoriation (skin-picking) disorder: Secondary | ICD-10-CM | POA: Diagnosis not present

## 2023-07-26 DIAGNOSIS — Z90721 Acquired absence of ovaries, unilateral: Secondary | ICD-10-CM | POA: Diagnosis not present

## 2023-07-26 DIAGNOSIS — Z90710 Acquired absence of both cervix and uterus: Secondary | ICD-10-CM | POA: Diagnosis not present

## 2023-07-26 DIAGNOSIS — N83201 Unspecified ovarian cyst, right side: Secondary | ICD-10-CM | POA: Diagnosis not present

## 2023-07-26 DIAGNOSIS — F4310 Post-traumatic stress disorder, unspecified: Secondary | ICD-10-CM | POA: Diagnosis not present

## 2023-07-26 DIAGNOSIS — F429 Obsessive-compulsive disorder, unspecified: Secondary | ICD-10-CM | POA: Diagnosis not present

## 2023-07-31 DIAGNOSIS — F4312 Post-traumatic stress disorder, chronic: Secondary | ICD-10-CM | POA: Diagnosis not present

## 2023-08-01 DIAGNOSIS — M25559 Pain in unspecified hip: Secondary | ICD-10-CM | POA: Diagnosis not present

## 2023-08-01 DIAGNOSIS — M25511 Pain in right shoulder: Secondary | ICD-10-CM | POA: Diagnosis not present

## 2023-08-01 DIAGNOSIS — M542 Cervicalgia: Secondary | ICD-10-CM | POA: Diagnosis not present

## 2023-08-01 DIAGNOSIS — M25651 Stiffness of right hip, not elsewhere classified: Secondary | ICD-10-CM | POA: Diagnosis not present

## 2023-08-02 DIAGNOSIS — F439 Reaction to severe stress, unspecified: Secondary | ICD-10-CM | POA: Diagnosis not present

## 2023-08-04 DIAGNOSIS — M26632 Articular disc disorder of left temporomandibular joint: Secondary | ICD-10-CM | POA: Diagnosis not present

## 2023-08-04 DIAGNOSIS — M26622 Arthralgia of left temporomandibular joint: Secondary | ICD-10-CM | POA: Diagnosis not present

## 2023-08-07 DIAGNOSIS — M25511 Pain in right shoulder: Secondary | ICD-10-CM | POA: Diagnosis not present

## 2023-08-07 DIAGNOSIS — M25651 Stiffness of right hip, not elsewhere classified: Secondary | ICD-10-CM | POA: Diagnosis not present

## 2023-08-07 DIAGNOSIS — F4312 Post-traumatic stress disorder, chronic: Secondary | ICD-10-CM | POA: Diagnosis not present

## 2023-08-07 DIAGNOSIS — M25559 Pain in unspecified hip: Secondary | ICD-10-CM | POA: Diagnosis not present

## 2023-08-07 DIAGNOSIS — M542 Cervicalgia: Secondary | ICD-10-CM | POA: Diagnosis not present

## 2023-08-09 DIAGNOSIS — K921 Melena: Secondary | ICD-10-CM | POA: Diagnosis not present

## 2023-08-09 DIAGNOSIS — R278 Other lack of coordination: Secondary | ICD-10-CM | POA: Diagnosis not present

## 2023-08-09 DIAGNOSIS — K5900 Constipation, unspecified: Secondary | ICD-10-CM | POA: Diagnosis not present

## 2023-08-21 DIAGNOSIS — M25511 Pain in right shoulder: Secondary | ICD-10-CM | POA: Diagnosis not present

## 2023-08-21 DIAGNOSIS — M25559 Pain in unspecified hip: Secondary | ICD-10-CM | POA: Diagnosis not present

## 2023-08-21 DIAGNOSIS — M542 Cervicalgia: Secondary | ICD-10-CM | POA: Diagnosis not present

## 2023-08-21 DIAGNOSIS — M25651 Stiffness of right hip, not elsewhere classified: Secondary | ICD-10-CM | POA: Diagnosis not present

## 2023-09-30 IMAGING — DX DG SHOULDER 2+V*R*
3 series · 3 of 3 positions shown · non-contrast
Comparison: None.

CLINICAL DATA: Chronic right shoulder pain.

EXAM:
RIGHT SHOULDER - 2+ VIEW

[shoulder ap (1 of 2)]
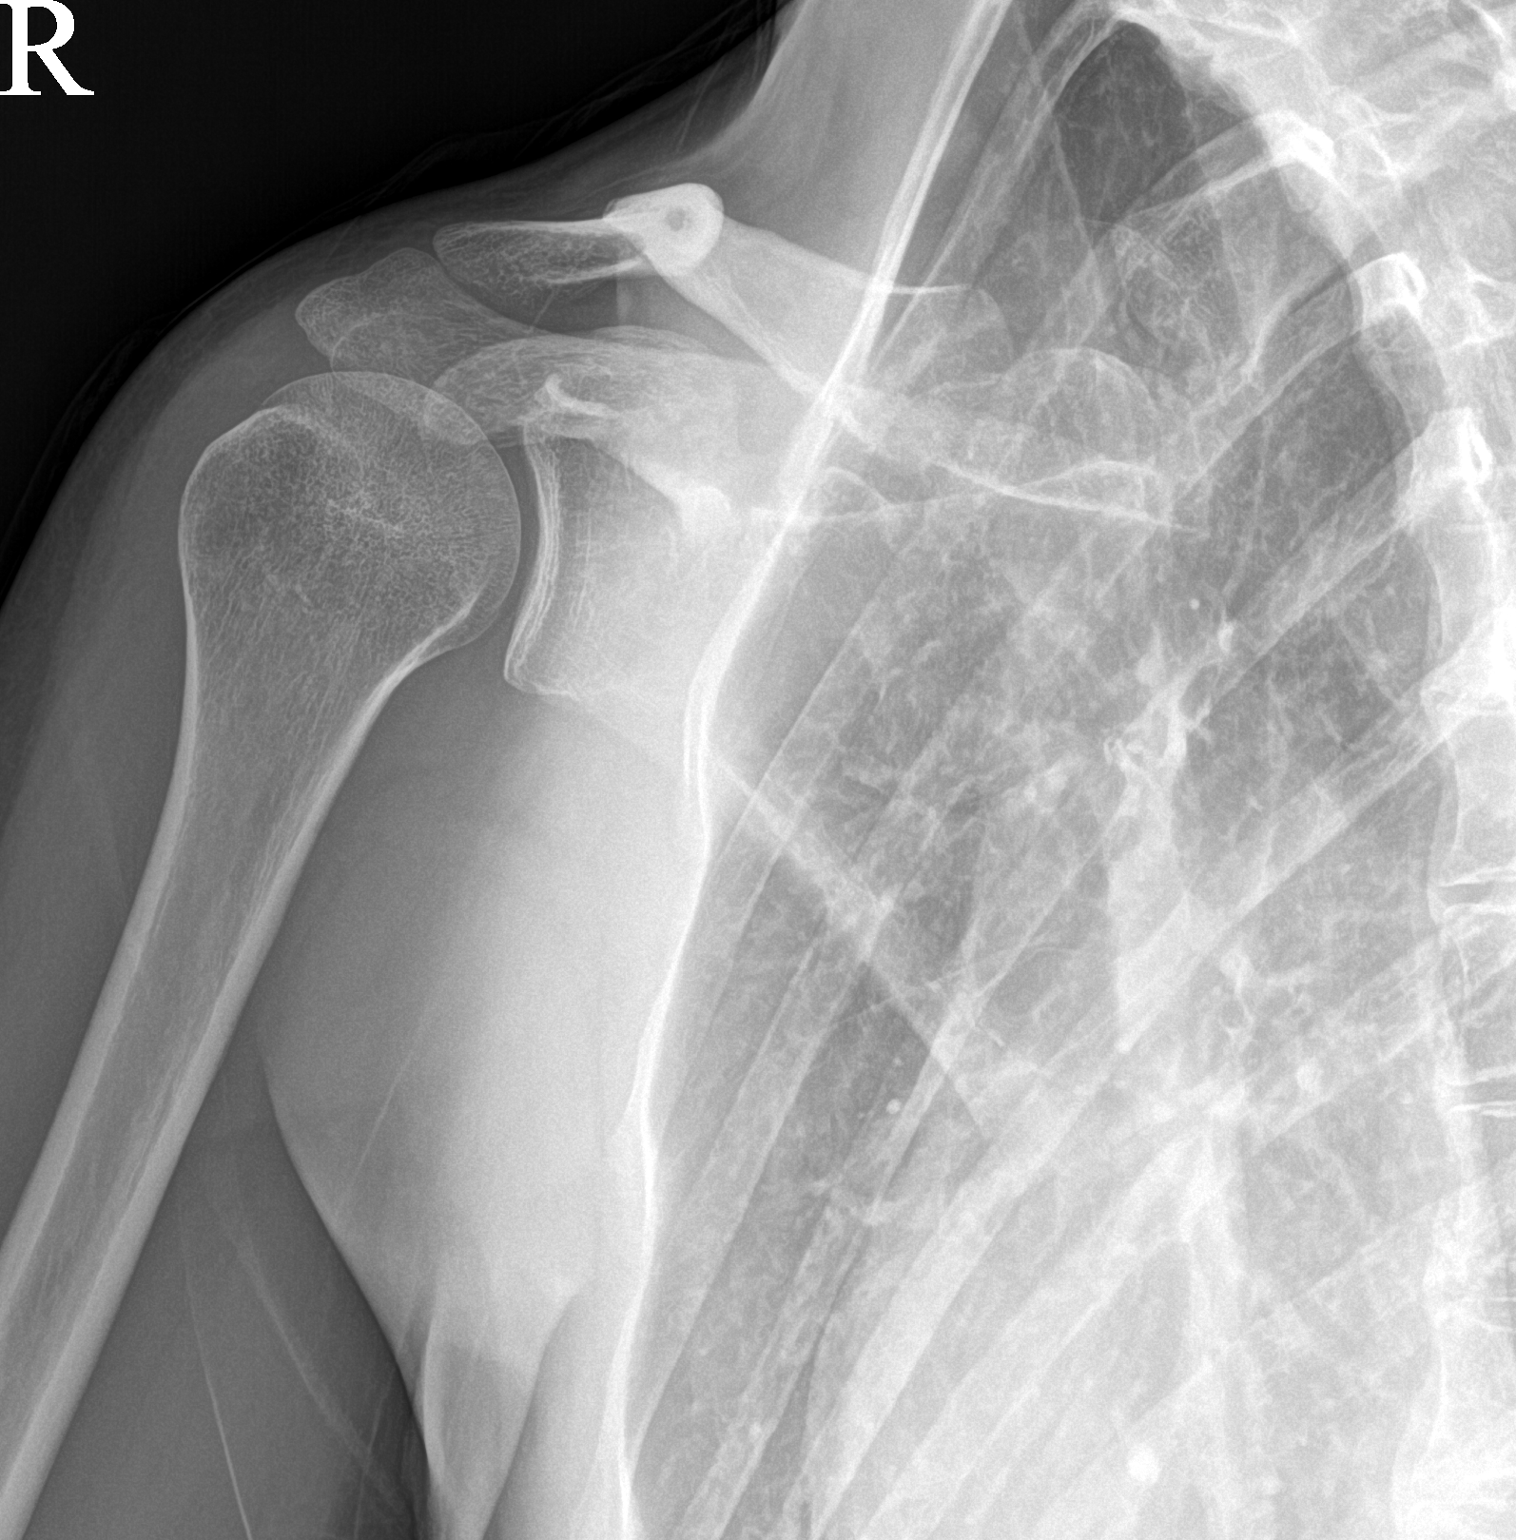

[shoulder ap (2 of 2)]
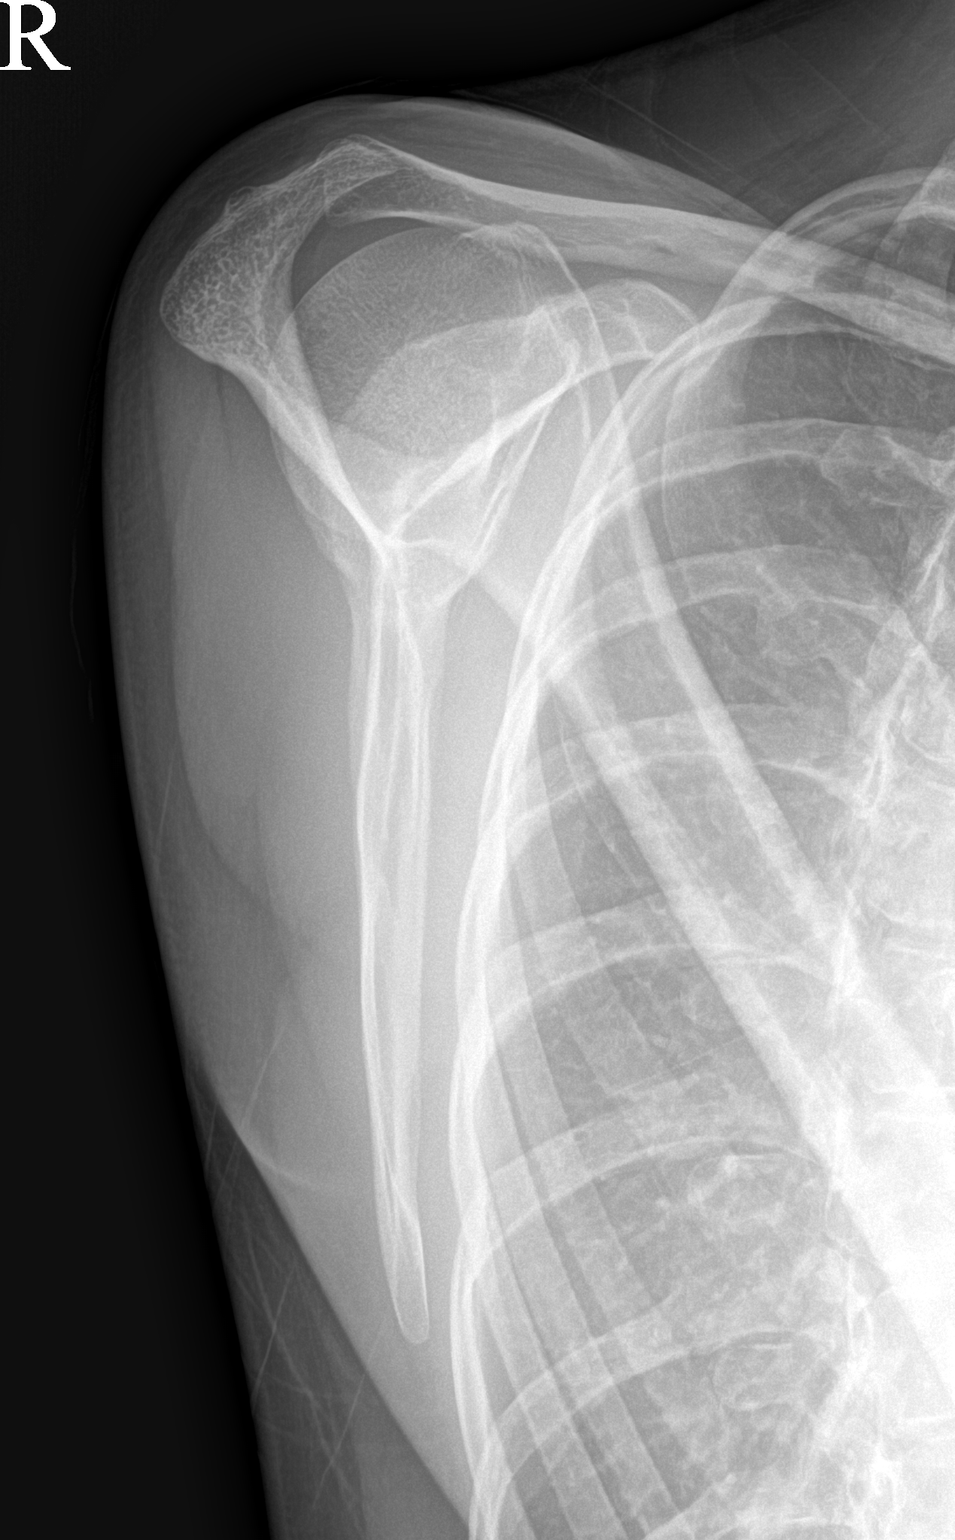

[shoulder axial]
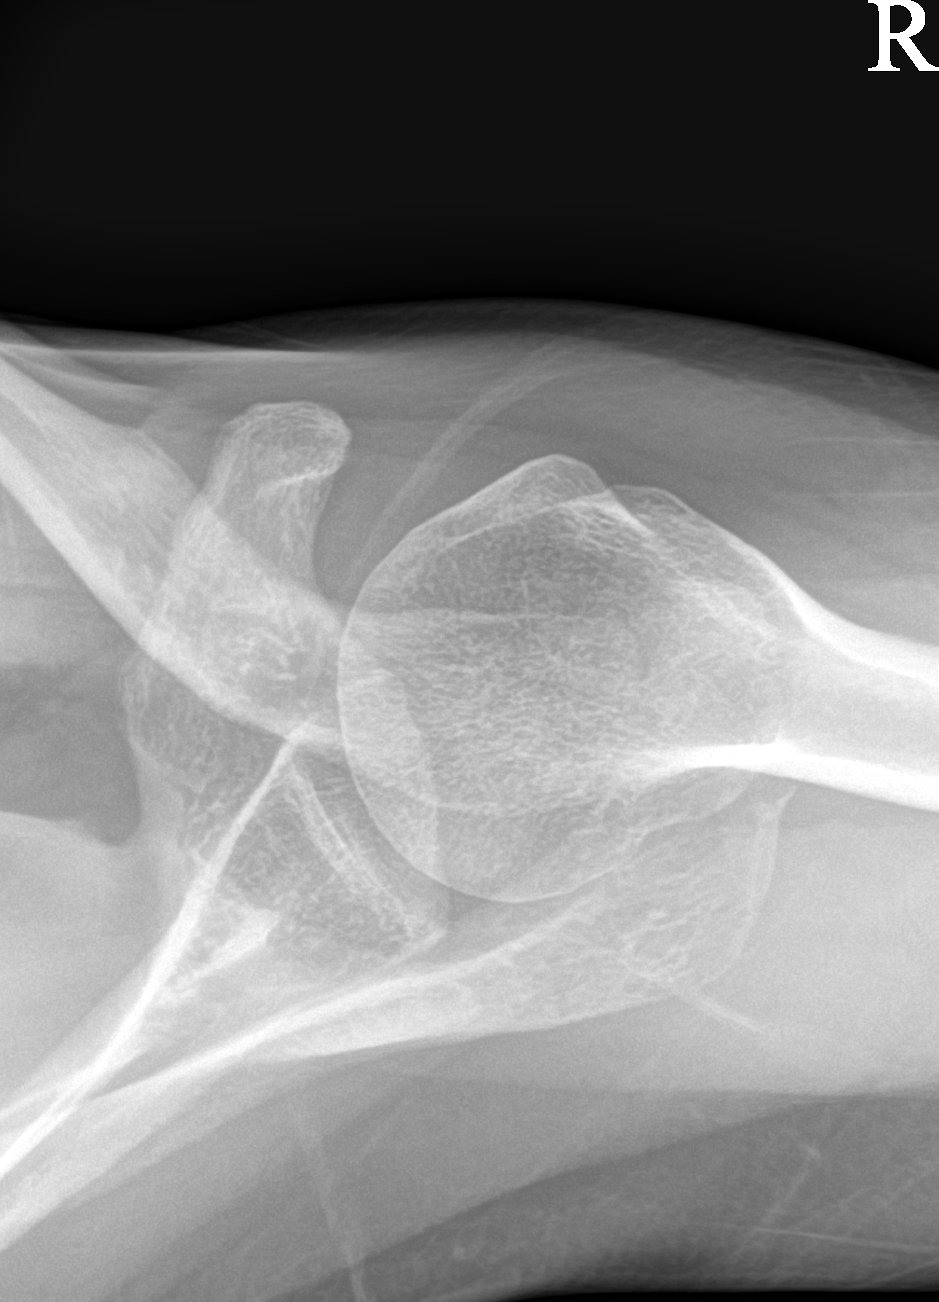

[3 of 3 positions shown; findings below may reference images not displayed]

FINDINGS: Normal bone mineralization. Joint spaces are preserved. No acute
fracture is seen. No dislocation.
IMPRESSION: Normal right shoulder radiographs.

## 2024-02-08 ENCOUNTER — Other Ambulatory Visit (HOSPITAL_COMMUNITY)

## 2024-02-08 NOTE — Progress Notes (Signed)
 [facility] [address] [place] [address] Phone: [phone]   Assessment and Plan:     1. Chronic right shoulder pain 2. Tendinopathy of right rotator cuff  -Chronic with exacerbation, subsequent visit - Recurrence of right shoulder pain most consistent with rotator cuff tendinopathy versus subacromial bursitis - Patient had right shoulder MRI in 2024 that was unremarkable - Patient has received 6+ months relief from CSI in the past and elected for repeat CSI today.  Tolerated well per note below - Do not recommend prescription NSAIDs due to history of congenital solitary kidney - Continue HEP  Procedure: Subacromial Injection Side: Right  Risks explained and consent was given verbally. The site was cleaned with alcohol prep. A steroid injection was performed from posterior approach using 2mL of 1% lidocaine  without epinephrine and 1mL of kenalog 40mg /ml. This was well tolerated and resulted in symptomatic relief.  Needle was removed, hemostasis achieved, and post injection instructions were explained.   Pt was advised to call or return to clinic if these symptoms worsen or fail to improve as anticipated.   Pertinent previous records reviewed include none  Follow Up: As needed   Subjective:   I, Regina Kerr am a scribe for Dr. Leonce.     Chief Complaint: right shoulder pain    HPI:  11/15/2021 Patient is a 28 year old female complaining of right shoulder pain. Patient states that they have a 100 join problems moved here from [place]  last summer, injured right shoulder  playing volleyball and aerial silks 3-4 years ago has been to PT but the pain never went away. Is playing vb now and cant hit the ball overhand without pain in the front of the shoulder , no numbness tingling , no radiating pain, has tried ib and tylenol and they do nothing to help, takes meloxicam  daily has been on long term, decreased ROM     08/23/2022 Patient states that right shoulder flared about 2 weeks , no MOI, same symptoms    01/26/2023 Patient states that pain is back and would like a CSI   06/01/2023 Patient states here for a CSI . Pain came back about a month ao   02/09/2024 Patient states feeling ok. The shoulder comes back with certain movements.    Relevant Historical Information: Congenital unilateral kidney  Additional pertinent review of systems negative.   Current Outpatient Medications:    lacosamide  (VIMPAT ) 200 MG TABS tablet, TAKE 1 TABLET BY MOUTH TWICE A DAY, Disp: 60 tablet, Rfl: 5   Midazolam  (NAYZILAM ) 5 MG/0.1ML SOLN, Administer one dose in nostril as needed for seizure. May use second dose in other nostril in another 10 minutes if seizure continues, Disp: 5 each, Rfl: 5   mirtazapine  (REMERON ) 30 MG tablet, Take 2 tablets (60 mg total) by mouth daily., Disp: 180 tablet, Rfl: 1   venlafaxine XR (EFFEXOR-XR) 75 MG 24 hr capsule, Take 75 mg by mouth daily with breakfast., Disp: , Rfl:    Objective:     Vitals:   02/09/24 1000  BP: 110/70  Pulse: 85  SpO2: 98%  Weight: 186 lb (84.4 kg)  Height: 5' 10 (1.778 m)      Body mass index is 26.69 kg/m.    Physical Exam:     Gen: Appears well, nad, nontoxic and pleasant Neuro:sensation intact, strength is 5/5 with df/pf/inv/ev, muscle tone wnl Skin: no suspicious lesion or defmority Psych: A&O, appropriate mood and affect  Right shoulder: no deformity, swelling or muscle wasting No scapular winging FF 180 with painful arc, abd 180, int 5 with painful arc, ext 90 TTP AC, deltoid NTTP over the [place], clavicle,   coracoid, biceps groove, humerus, deltoid,  , cervical spine Positive Hawking's, O'Brien's, Neer Neg   empty can,  , crossarm, subscap liftoff, speeds Neg ant drawer, sulcus sign, apprehension Negative Spurling's test bilat FROM of neck       Electronically signed by:  [facility] 10:09 AM  02/09/24

## 2024-02-09 ENCOUNTER — Other Ambulatory Visit (HOSPITAL_COMMUNITY)
Admission: RE | Admit: 2024-02-09 | Discharge: 2024-02-09 | Disposition: A | Discharge status: Home / Self Care | Payer: Self-pay | Source: Ambulatory Visit | Attending: Oncology | Admitting: Oncology

## 2024-02-09 ENCOUNTER — Ambulatory Visit: Admitting: Sports Medicine

## 2024-02-09 VITALS — BP 110/70 | HR 85 | Ht 70.0 in | Wt 186.0 lb

## 2024-02-09 DIAGNOSIS — G8929 Other chronic pain: Secondary | ICD-10-CM | POA: Diagnosis not present

## 2024-02-09 DIAGNOSIS — M67911 Unspecified disorder of synovium and tendon, right shoulder: Secondary | ICD-10-CM

## 2024-02-09 DIAGNOSIS — M25511 Pain in right shoulder: Secondary | ICD-10-CM

## 2024-02-20 LAB — GENECONNECT MOLECULAR SCREEN: Genetic Analysis Overall Interpretation: NEGATIVE

## 2024-07-04 ENCOUNTER — Encounter (INDEPENDENT_AMBULATORY_CARE_PROVIDER_SITE_OTHER): Payer: Self-pay

## 2024-07-11 NOTE — Progress Notes (Deleted)
    Regina Kerr [address] [place] [address] Phone: [phone]   Assessment and Plan:     ***    Pertinent previous records reviewed include ***   Follow Up: ***     Subjective:   I, [name], am serving as a neurosurgeon for [name]  Chief Complaint: right shoulder pain    HPI:  11/15/2021 Patient is a 28 year old female complaining of right shoulder pain. Patient states that they have a 100 join problems moved here from [place]  last summer, injured right shoulder  playing volleyball and aerial silks 3-4 years ago has been to PT but the pain never went away. Is playing vb now and cant hit the ball overhand without pain in the front of the shoulder , no numbness tingling , no radiating pain, has tried ib and tylenol and they do nothing to help, takes meloxicam  daily has been on long term, decreased ROM    08/23/2022 Patient states that right shoulder flared about 2 weeks , no MOI, same symptoms    01/26/2023 Patient states that pain is back and would like a CSI   06/01/2023 Patient states here for a CSI . Pain came back about a month ao    02/09/2024 Patient states feeling ok. The shoulder comes back with certain movements.   07/15/2024 Patient states   Relevant Historical Information: Congenital unilateral kidney    Additional pertinent review of systems negative.   Current Outpatient Medications:    lacosamide  (VIMPAT ) 200 MG TABS tablet, TAKE 1 TABLET BY MOUTH TWICE A DAY, Disp: 60 tablet, Rfl: 5   Midazolam  (NAYZILAM ) 5 MG/0.1ML SOLN, Administer one dose in nostril as needed for seizure. May use second dose in other nostril in another 10 minutes if seizure continues, Disp: 5 each, Rfl: 5   mirtazapine  (REMERON ) 30 MG tablet, Take 2 tablets (60 mg total) by mouth daily., Disp: 180 tablet, Rfl: 1   venlafaxine XR (EFFEXOR-XR) 75 MG 24 hr capsule, Take 75 mg by mouth daily with breakfast., Disp:  , Rfl:    Objective:     There were no vitals filed for this visit.    There is no height or weight on file to calculate BMI.    Physical Exam:    ***   Electronically signed by:  [name] D.[name] [name] 7:46 AM 07/11/24

## 2024-07-15 ENCOUNTER — Ambulatory Visit: Admitting: Sports Medicine

## 2024-07-24 NOTE — Progress Notes (Unsigned)
 [facility] [address] [place] [address] Phone: [phone]   Assessment and Plan:     1. Chronic right shoulder pain (Primary) 2. Tendinopathy of right rotator cuff -Chronic with exacerbation, subsequent visit - Recurrence of right shoulder pain consistent with right rotator cuff tendinopathy versus subacromial bursitis - Patient's right shoulder MRI in 2024 was unremarkable - Patient has received 6+ months relief from subacromial CSI in the past.  Last CSI on 02/09/2024.  Elected for repeat CSI today and tolerated well per note below. - Do not recommend prescription NSAIDs due to history of congenital solitary kidney - Continue HEP  Procedure: Subacromial Injection Side: Right  Risks explained and consent was given verbally. The site was cleaned with alcohol prep. A steroid injection was performed from posterior approach using 2mL of 1% lidocaine  without epinephrine and 1mL of kenalog 40mg /ml. This was well tolerated.  Needle was removed, hemostasis achieved, and post injection instructions were explained.   Pt was advised to call or return to clinic if these symptoms worsen or fail to improve as anticipated.   3. Strain of left trapezius muscle, initial encounter -Acute, initial visit - Consistent with left trapezius strain likely due to sleeping position - Start prednisone  Dosepak - Start HEP for trapezius  15 additional minutes spent for educating Therapeutic Home Exercise Program.  This included exercises focusing on stretching, strengthening, with focus on eccentric aspects.   Long term goals include an improvement in range of motion, strength, endurance as well as avoiding reinjury. Patient's frequency would include in 1-2 times a day, 3-5 times a week for a duration of 6-12 weeks. Proper technique shown and discussed handout in great detail with ATC.  All questions were discussed and answered.      Pertinent previous  records reviewed include none   Follow Up: As needed if no improvement in 2 weeks.  Could consider trigger point injections   Subjective:   I, Regina Kerr, am serving as a neurosurgeon for Doctor Regina Kerr  Chief Complaint: right shoulder pain    HPI:  11/15/2021 Patient is a 28 year old female complaining of right shoulder pain. Patient states that they have a 100 join problems moved here from [place]  last summer, injured right shoulder  playing volleyball and aerial silks 3-4 years ago has been to PT but the pain never went away. Is playing vb now and cant hit the ball overhand without pain in the front of the shoulder , no numbness tingling , no radiating pain, has tried ib and tylenol and they do nothing to help, takes meloxicam  daily has been on long term, decreased ROM    08/23/2022 Patient states that right shoulder flared about 2 weeks , no MOI, same symptoms    01/26/2023 Patient states that pain is back and would like a CSI   06/01/2023 Patient states here for a CSI . Pain came back about a month ao    02/09/2024 Patient states feeling ok. The shoulder comes back with certain movements.   07/25/2024 Patient states ready for injection. Sunday she woke up with upper trap tightness left side. Massage, PT,Tens, heat , and stretching hasn't helped much. Laying on back is painful    Relevant Historical Information: Congenital unilateral kidney  Additional pertinent review of systems negative.   Current Outpatient Medications:    methylPREDNISolone (MEDROL DOSEPAK) 4 MG TBPK tablet, Take 6 tablets on day 1.  Take 5 tablets on day  2.  Take 4 tablets on day 3.  Take 3 tablets on day 4.  Take 2 tablets on day 5.  Take 1 tablet on day 6., Disp: 21 tablet, Rfl: 0   lacosamide  (VIMPAT ) 200 MG TABS tablet, TAKE 1 TABLET BY MOUTH TWICE A DAY, Disp: 60 tablet, Rfl: 5   Midazolam  (NAYZILAM ) 5 MG/0.1ML SOLN, Administer one dose in nostril as needed for seizure. May use second dose in  other nostril in another 10 minutes if seizure continues, Disp: 5 each, Rfl: 5   mirtazapine  (REMERON ) 30 MG tablet, Take 2 tablets (60 mg total) by mouth daily., Disp: 180 tablet, Rfl: 1   venlafaxine XR (EFFEXOR-XR) 75 MG 24 hr capsule, Take 75 mg by mouth daily with breakfast., Disp: , Rfl:    Objective:     Vitals:   07/25/24 1054  BP: 122/80  Weight: 191 lb (86.6 kg)  Height: 5' 10 (1.778 m)      Body mass index is 27.41 kg/m.    Physical Exam:    Gen: Appears well, nad, nontoxic and pleasant Neuro:sensation intact, strength is 5/5 with df/pf/inv/ev, muscle tone wnl Skin: no suspicious lesion or defmority Psych: A&O, appropriate mood and affect   Right shoulder: no deformity, swelling or muscle wasting No scapular winging FF 180 with painful arc, abd 180, int 5 with painful arc, ext 90 TTP AC, deltoid NTTP over the [place], clavicle,   coracoid, biceps groove, humerus, deltoid,  , cervical spine Positive Hawking's, O'Brien's, Neer Neg   empty can,  , crossarm, subscap liftoff, speeds Neg ant drawer, sulcus sign, apprehension Negative Spurling's test bilat  Reduced ROM of neck in all directions with left-sided neck pain  TTP left trapezius, rhomboid    Electronically signed by:  [facility]ina [facility] 11:12 AM 07/25/24

## 2024-07-25 ENCOUNTER — Ambulatory Visit: Admitting: Sports Medicine

## 2024-07-25 VITALS — BP 122/80 | Ht 70.0 in | Wt 191.0 lb

## 2024-07-25 DIAGNOSIS — S46812A Strain of other muscles, fascia and tendons at shoulder and upper arm level, left arm, initial encounter: Secondary | ICD-10-CM

## 2024-07-25 DIAGNOSIS — G8929 Other chronic pain: Secondary | ICD-10-CM

## 2024-07-25 DIAGNOSIS — M67911 Unspecified disorder of synovium and tendon, right shoulder: Secondary | ICD-10-CM

## 2024-07-25 MED ORDER — METHYLPREDNISOLONE 4 MG PO TBPK: ORAL_TABLET | ORAL | 0 refills | Status: AC

## 2024-07-25 NOTE — Patient Instructions (Signed)
 Prednisone  dos pak   Trap HEP   As needed follow up
